# Patient Record
Sex: Male | Born: 1950 | Race: Black or African American | Hispanic: No | Marital: Married
Health system: Southern US, Community
[De-identification: ages and names within clinical notes are randomized; demographics above are authoritative.]

---

## 2005-09-01 ENCOUNTER — Ambulatory Visit: Payer: Self-pay | Admitting: Internal Medicine

## 2005-09-05 ENCOUNTER — Ambulatory Visit: Payer: Self-pay | Admitting: Internal Medicine

## 2005-09-12 ENCOUNTER — Ambulatory Visit: Payer: Self-pay | Admitting: Internal Medicine

## 2005-10-05 ENCOUNTER — Emergency Department: Payer: Self-pay | Admitting: Emergency Medicine

## 2005-10-05 ENCOUNTER — Other Ambulatory Visit: Payer: Self-pay

## 2006-08-13 ENCOUNTER — Ambulatory Visit: Payer: Self-pay | Admitting: Gastroenterology

## 2007-08-13 ENCOUNTER — Ambulatory Visit: Payer: Self-pay | Admitting: Cardiology

## 2011-07-10 ENCOUNTER — Ambulatory Visit: Payer: Self-pay | Admitting: Internal Medicine

## 2011-08-01 ENCOUNTER — Inpatient Hospital Stay: Payer: Self-pay | Admitting: Internal Medicine

## 2011-08-10 ENCOUNTER — Ambulatory Visit: Payer: Self-pay | Admitting: Internal Medicine

## 2011-08-31 ENCOUNTER — Ambulatory Visit: Payer: Self-pay | Admitting: Family Medicine

## 2012-01-09 ENCOUNTER — Inpatient Hospital Stay: Payer: Self-pay | Admitting: Internal Medicine

## 2012-01-09 LAB — COMPREHENSIVE METABOLIC PANEL
Albumin: 3.4 g/dL (ref 3.4–5.0)
Alkaline Phosphatase: 125 U/L (ref 50–136)
Anion Gap: 9 (ref 7–16)
Chloride: 108 mmol/L — ABNORMAL HIGH (ref 98–107)
Creatinine: 1.42 mg/dL — ABNORMAL HIGH (ref 0.60–1.30)
EGFR (Non-African Amer.): 54 — ABNORMAL LOW
Osmolality: 305 (ref 275–301)
Potassium: 4.2 mmol/L (ref 3.5–5.1)
SGPT (ALT): 18 U/L

## 2012-01-09 LAB — TSH: Thyroid Stimulating Horm: 2.92 u[IU]/mL

## 2012-01-09 LAB — URINALYSIS, COMPLETE
Bilirubin,UR: NEGATIVE
Nitrite: NEGATIVE
Ph: 6 (ref 4.5–8.0)
Protein: 75
RBC,UR: 28 /HPF (ref 0–5)
Transitional Epi: 1

## 2012-01-09 LAB — CBC
HCT: 41.4 % (ref 40.0–52.0)
HGB: 13.5 g/dL (ref 13.0–18.0)
MCHC: 32.5 g/dL (ref 32.0–36.0)
WBC: 26.2 10*3/uL — ABNORMAL HIGH (ref 3.8–10.6)

## 2012-01-09 LAB — BASIC METABOLIC PANEL
BUN: 38 mg/dL — ABNORMAL HIGH (ref 7–18)
Calcium, Total: 9.4 mg/dL (ref 8.5–10.1)
Co2: 28 mmol/L (ref 21–32)
Creatinine: 1.33 mg/dL — ABNORMAL HIGH (ref 0.60–1.30)
EGFR (African American): >60
Glucose: 112 mg/dL — ABNORMAL HIGH (ref 65–99)
Osmolality: 298 (ref 275–301)
Potassium: 4.8 mmol/L (ref 3.5–5.1)

## 2012-01-09 LAB — CK TOTAL AND CKMB (NOT AT ARMC)
CK, Total: 97 U/L (ref 35–232)
CK-MB: 0.8 ng/mL (ref 0.5–3.6)

## 2012-01-10 LAB — CBC WITH DIFFERENTIAL/PLATELET
Basophil #: 0 10*3/uL (ref 0.0–0.1)
Basophil %: 0.2 %
Eosinophil %: 1 %
HCT: 34.9 % — ABNORMAL LOW (ref 40.0–52.0)
HGB: 11.1 g/dL — ABNORMAL LOW (ref 13.0–18.0)
Lymphocyte #: 1.5 10*3/uL (ref 1.0–3.6)
Lymphocyte %: 8.2 %
MCH: 25.9 pg — ABNORMAL LOW (ref 26.0–34.0)
Monocyte %: 7.2 %
Neutrophil #: 14.8 10*3/uL — ABNORMAL HIGH (ref 1.4–6.5)
Neutrophil %: 83.4 %
Platelet: 289 10*3/uL (ref 150–440)
RBC: 4.29 10*6/uL — ABNORMAL LOW (ref 4.40–5.90)
RDW: 17.5 % — ABNORMAL HIGH (ref 11.5–14.5)
WBC: 17.8 10*3/uL — ABNORMAL HIGH (ref 3.8–10.6)

## 2012-01-10 LAB — BASIC METABOLIC PANEL
Anion Gap: 9 (ref 7–16)
BUN: 33 mg/dL — ABNORMAL HIGH (ref 7–18)
Calcium, Total: 9.2 mg/dL (ref 8.5–10.1)
Chloride: 112 mmol/L — ABNORMAL HIGH (ref 98–107)
Co2: 29 mmol/L (ref 21–32)
EGFR (African American): >60
Glucose: 125 mg/dL — ABNORMAL HIGH (ref 65–99)
Osmolality: 307 (ref 275–301)
Potassium: 3.9 mmol/L (ref 3.5–5.1)
Sodium: 150 mmol/L — ABNORMAL HIGH (ref 136–145)

## 2012-01-11 LAB — BASIC METABOLIC PANEL
BUN: 22 mg/dL — ABNORMAL HIGH (ref 7–18)
Calcium, Total: 8.5 mg/dL (ref 8.5–10.1)
Chloride: 105 mmol/L (ref 98–107)
Co2: 27 mmol/L (ref 21–32)
Creatinine: 1.06 mg/dL (ref 0.60–1.30)
EGFR (African American): >60
EGFR (Non-African Amer.): >60
Osmolality: 285 (ref 275–301)
Potassium: 3.7 mmol/L (ref 3.5–5.1)
Sodium: 141 mmol/L (ref 136–145)

## 2012-01-11 LAB — CBC WITH DIFFERENTIAL/PLATELET
Basophil #: 0 10*3/uL (ref 0.0–0.1)
Basophil %: 0.1 %
HCT: 31.1 % — ABNORMAL LOW (ref 40.0–52.0)
Lymphocyte #: 1.5 10*3/uL (ref 1.0–3.6)
MCHC: 32.2 g/dL (ref 32.0–36.0)
MCV: 81 fL (ref 80–100)
Monocyte #: 1 10*3/uL — ABNORMAL HIGH (ref 0.0–0.7)
Monocyte %: 7.4 %
Neutrophil %: 79.4 %
RDW: 17.5 % — ABNORMAL HIGH (ref 11.5–14.5)
WBC: 13.3 10*3/uL — ABNORMAL HIGH (ref 3.8–10.6)

## 2012-01-11 LAB — URINE CULTURE

## 2012-01-12 LAB — BASIC METABOLIC PANEL
BUN: 12 mg/dL (ref 7–18)
Calcium, Total: 8.6 mg/dL (ref 8.5–10.1)
Chloride: 105 mmol/L (ref 98–107)
Co2: 25 mmol/L (ref 21–32)
Creatinine: 0.93 mg/dL (ref 0.60–1.30)
EGFR (African American): >60
Osmolality: 282 (ref 275–301)
Potassium: 3.5 mmol/L (ref 3.5–5.1)
Sodium: 141 mmol/L (ref 136–145)

## 2012-01-12 LAB — CBC WITH DIFFERENTIAL/PLATELET
Basophil #: 0 10*3/uL (ref 0.0–0.1)
Eosinophil %: 1.5 %
HCT: 31.2 % — ABNORMAL LOW (ref 40.0–52.0)
HGB: 9.9 g/dL — ABNORMAL LOW (ref 13.0–18.0)
Lymphocyte #: 1.5 10*3/uL (ref 1.0–3.6)
Lymphocyte %: 13.1 %
MCV: 82 fL (ref 80–100)
Neutrophil %: 73.5 %
Platelet: 246 10*3/uL (ref 150–440)
RDW: 16.4 % — ABNORMAL HIGH (ref 11.5–14.5)
WBC: 11.2 10*3/uL — ABNORMAL HIGH (ref 3.8–10.6)

## 2012-01-14 LAB — CBC WITH DIFFERENTIAL/PLATELET
Basophil #: 0 10*3/uL (ref 0.0–0.1)
Basophil %: 0.2 %
Eosinophil #: 0.2 10*3/uL (ref 0.0–0.7)
Eosinophil %: 1.4 %
HGB: 10.1 g/dL — ABNORMAL LOW (ref 13.0–18.0)
Lymphocyte #: 1.5 10*3/uL (ref 1.0–3.6)
MCV: 81 fL (ref 80–100)
Monocyte #: 1.1 10*3/uL — ABNORMAL HIGH (ref 0.0–0.7)
Monocyte %: 9.9 %
Neutrophil #: 8.5 10*3/uL — ABNORMAL HIGH (ref 1.4–6.5)
Neutrophil %: 75.2 %
RBC: 3.88 10*6/uL — ABNORMAL LOW (ref 4.40–5.90)
RDW: 17.3 % — ABNORMAL HIGH (ref 11.5–14.5)
WBC: 11.3 10*3/uL — ABNORMAL HIGH (ref 3.8–10.6)

## 2012-01-14 LAB — BASIC METABOLIC PANEL
BUN: 14 mg/dL (ref 7–18)
Calcium, Total: 8.8 mg/dL (ref 8.5–10.1)
Creatinine: 0.81 mg/dL (ref 0.60–1.30)
Potassium: 4 mmol/L (ref 3.5–5.1)

## 2012-02-06 ENCOUNTER — Encounter: Payer: Self-pay | Admitting: Family Medicine

## 2012-02-07 ENCOUNTER — Encounter: Payer: Self-pay | Admitting: Family Medicine

## 2012-03-06 ENCOUNTER — Emergency Department: Payer: Self-pay

## 2012-03-06 LAB — URINALYSIS, COMPLETE
Bilirubin,UR: NEGATIVE
Glucose,UR: NEGATIVE mg/dL (ref 0–75)
Ketone: NEGATIVE
Ph: 9 (ref 4.5–8.0)
Protein: NEGATIVE
RBC,UR: 193 /HPF (ref 0–5)
WBC UR: 185 /HPF (ref 0–5)

## 2012-03-06 LAB — BASIC METABOLIC PANEL
BUN: 24 mg/dL — ABNORMAL HIGH (ref 7–18)
Calcium, Total: 8.6 mg/dL (ref 8.5–10.1)
Chloride: 104 mmol/L (ref 98–107)
Creatinine: 0.84 mg/dL (ref 0.60–1.30)
EGFR (African American): >60
EGFR (Non-African Amer.): >60
Osmolality: 283 (ref 275–301)
Sodium: 140 mmol/L (ref 136–145)

## 2012-03-06 LAB — CBC
MCH: 26.4 pg (ref 26.0–34.0)
MCV: 82 fL (ref 80–100)
Platelet: 305 10*3/uL (ref 150–440)
RBC: 3.97 10*6/uL — ABNORMAL LOW (ref 4.40–5.90)
RDW: 18.2 % — ABNORMAL HIGH (ref 11.5–14.5)

## 2012-03-08 LAB — URINE CULTURE

## 2012-05-11 ENCOUNTER — Emergency Department: Payer: Self-pay | Admitting: *Deleted

## 2012-05-11 LAB — URINALYSIS, COMPLETE
Blood: NEGATIVE
Glucose,UR: NEGATIVE mg/dL (ref 0–75)
Ketone: NEGATIVE
Protein: 30
Specific Gravity: 1.006 (ref 1.003–1.030)
WBC UR: 45 /HPF (ref 0–5)

## 2012-05-11 LAB — COMPREHENSIVE METABOLIC PANEL
Albumin: 2.7 g/dL — ABNORMAL LOW (ref 3.4–5.0)
Anion Gap: 8 (ref 7–16)
Calcium, Total: 9.1 mg/dL (ref 8.5–10.1)
Chloride: 103 mmol/L (ref 98–107)
EGFR (African American): >60
EGFR (Non-African Amer.): >60
Glucose: 113 mg/dL — ABNORMAL HIGH (ref 65–99)
Osmolality: 280 (ref 275–301)
Potassium: 4.5 mmol/L (ref 3.5–5.1)
SGOT(AST): 29 U/L (ref 15–37)
Sodium: 139 mmol/L (ref 136–145)
Total Protein: 7.7 g/dL (ref 6.4–8.2)

## 2012-05-11 LAB — CBC WITH DIFFERENTIAL/PLATELET
Basophil %: 1.8 %
Eosinophil %: 1.8 %
MCH: 26.8 pg (ref 26.0–34.0)
MCHC: 32.8 g/dL (ref 32.0–36.0)
MCV: 82 fL (ref 80–100)
Monocyte #: 1.4 x10 3/mm — ABNORMAL HIGH (ref 0.2–1.0)
Neutrophil #: 11.4 10*3/uL — ABNORMAL HIGH (ref 1.4–6.5)
Neutrophil %: 76.2 %
RBC: 4.46 10*6/uL (ref 4.40–5.90)
WBC: 14.9 10*3/uL — ABNORMAL HIGH (ref 3.8–10.6)

## 2012-09-07 ENCOUNTER — Emergency Department: Payer: Self-pay | Admitting: Emergency Medicine

## 2012-09-18 ENCOUNTER — Inpatient Hospital Stay: Payer: Self-pay | Admitting: Internal Medicine

## 2012-09-18 LAB — URINALYSIS, COMPLETE
Bacteria: NEGATIVE
Glucose,UR: NEGATIVE mg/dL (ref 0–75)
Ketone: NEGATIVE
Nitrite: NEGATIVE
Ph: 6 (ref 4.5–8.0)
Protein: 30

## 2012-09-18 LAB — CBC WITH DIFFERENTIAL/PLATELET
Basophil #: 0.1 10*3/uL (ref 0.0–0.1)
Basophil %: 0.3 %
Eosinophil #: 0 10*3/uL (ref 0.0–0.7)
Eosinophil %: 0.1 %
HGB: 10.6 g/dL — ABNORMAL LOW (ref 13.0–18.0)
Lymphocyte %: 3.7 %
MCHC: 31.4 g/dL — ABNORMAL LOW (ref 32.0–36.0)
MCV: 81 fL (ref 80–100)
Monocyte #: 1.6 x10 3/mm — ABNORMAL HIGH (ref 0.2–1.0)
Neutrophil %: 90.1 %
Platelet: 356 10*3/uL (ref 150–440)
RBC: 4.19 10*6/uL — ABNORMAL LOW (ref 4.40–5.90)

## 2012-09-18 LAB — COMPREHENSIVE METABOLIC PANEL
BUN: 23 mg/dL — ABNORMAL HIGH (ref 7–18)
Bilirubin,Total: 0.2 mg/dL (ref 0.2–1.0)
Chloride: 104 mmol/L (ref 98–107)
Co2: 27 mmol/L (ref 21–32)
Creatinine: 0.99 mg/dL (ref 0.60–1.30)
EGFR (African American): >60
EGFR (Non-African Amer.): >60
Osmolality: 283 (ref 275–301)
Potassium: 4.7 mmol/L (ref 3.5–5.1)
SGOT(AST): 20 U/L (ref 15–37)
SGPT (ALT): 17 U/L (ref 12–78)
Sodium: 139 mmol/L (ref 136–145)
Total Protein: 7.8 g/dL (ref 6.4–8.2)

## 2012-09-18 LAB — RAPID INFLUENZA A&B ANTIGENS

## 2012-09-19 LAB — CBC WITH DIFFERENTIAL/PLATELET
Basophil #: 0.1 10*3/uL (ref 0.0–0.1)
Basophil %: 0.4 %
Eosinophil #: 0.3 10*3/uL (ref 0.0–0.7)
HGB: 9.6 g/dL — ABNORMAL LOW (ref 13.0–18.0)
Lymphocyte #: 0.8 10*3/uL — ABNORMAL LOW (ref 1.0–3.6)
Lymphocyte %: 4.7 %
MCHC: 31.5 g/dL — ABNORMAL LOW (ref 32.0–36.0)
MCV: 81 fL (ref 80–100)
Monocyte %: 7.5 %
Neutrophil %: 85.5 %

## 2012-09-19 LAB — BASIC METABOLIC PANEL
BUN: 20 mg/dL — ABNORMAL HIGH (ref 7–18)
Calcium, Total: 8.6 mg/dL (ref 8.5–10.1)
Co2: 26 mmol/L (ref 21–32)
EGFR (African American): >60
Glucose: 140 mg/dL — ABNORMAL HIGH (ref 65–99)
Osmolality: 286 (ref 275–301)
Potassium: 4 mmol/L (ref 3.5–5.1)
Sodium: 141 mmol/L (ref 136–145)

## 2012-09-20 LAB — VANCOMYCIN, TROUGH: Vancomycin, Trough: 16 ug/mL (ref 10–20)

## 2012-09-20 LAB — URINE CULTURE

## 2012-09-21 LAB — CBC WITH DIFFERENTIAL/PLATELET
Basophil #: 0.1 10*3/uL (ref 0.0–0.1)
Lymphocyte #: 1.1 10*3/uL (ref 1.0–3.6)
MCH: 26.8 pg (ref 26.0–34.0)
MCHC: 32.9 g/dL (ref 32.0–36.0)
MCV: 81 fL (ref 80–100)
Monocyte #: 1.6 x10 3/mm — ABNORMAL HIGH (ref 0.2–1.0)
Monocyte %: 8.5 %
Neutrophil %: 83.4 %
Platelet: 339 10*3/uL (ref 150–440)
RBC: 3.74 10*6/uL — ABNORMAL LOW (ref 4.40–5.90)
RDW: 15.7 % — ABNORMAL HIGH (ref 11.5–14.5)
WBC: 19.2 10*3/uL — ABNORMAL HIGH (ref 3.8–10.6)

## 2012-09-22 LAB — CREATININE, SERUM
Creatinine: 0.94 mg/dL
EGFR (African American): >60
EGFR (Non-African Amer.): >60

## 2012-09-22 LAB — CBC WITH DIFFERENTIAL/PLATELET
Basophil #: 0 10*3/uL
Basophil %: 0.3 %
Eosinophil #: 0.3 10*3/uL
Eosinophil %: 1.8 %
HCT: 28.5 % — ABNORMAL LOW
HGB: 9.5 g/dL — ABNORMAL LOW
Lymphocyte %: 10.2 %
Lymphs Abs: 1.6 10*3/uL
MCH: 26.9 pg
MCHC: 33.2 g/dL
MCV: 81 fL
Monocyte #: 1.6 10*3/uL — ABNORMAL HIGH
Monocyte %: 9.9 %
Neutrophil #: 12.6 10*3/uL — ABNORMAL HIGH
Neutrophil %: 77.8 %
Platelet: 318 10*3/uL
RBC: 3.51 x10 6/mm 3 — ABNORMAL LOW
RDW: 16.2 % — ABNORMAL HIGH
WBC: 16.2 10*3/uL — ABNORMAL HIGH

## 2012-09-23 LAB — CBC WITH DIFFERENTIAL/PLATELET
Basophil %: 0.5 %
Eosinophil %: 1.9 %
HCT: 29.1 % — ABNORMAL LOW (ref 40.0–52.0)
HGB: 9.2 g/dL — ABNORMAL LOW (ref 13.0–18.0)
MCH: 25.6 pg — ABNORMAL LOW (ref 26.0–34.0)
MCV: 81 fL (ref 80–100)
Monocyte #: 1.7 x10 3/mm — ABNORMAL HIGH (ref 0.2–1.0)
Monocyte %: 11.4 %
Neutrophil #: 11.3 10*3/uL — ABNORMAL HIGH (ref 1.4–6.5)
Neutrophil %: 75.9 %
Platelet: 312 10*3/uL (ref 150–440)
RBC: 3.6 10*6/uL — ABNORMAL LOW (ref 4.40–5.90)
WBC: 14.9 10*3/uL — ABNORMAL HIGH (ref 3.8–10.6)

## 2012-09-24 LAB — COMPREHENSIVE METABOLIC PANEL
Anion Gap: 7 (ref 7–16)
BUN: 11 mg/dL (ref 7–18)
Bilirubin,Total: 0.1 mg/dL — ABNORMAL LOW (ref 0.2–1.0)
Chloride: 107 mmol/L (ref 98–107)
Co2: 25 mmol/L (ref 21–32)
Creatinine: 0.92 mg/dL (ref 0.60–1.30)
EGFR (African American): >60
EGFR (Non-African Amer.): >60
Potassium: 3.8 mmol/L (ref 3.5–5.1)
SGPT (ALT): 13 U/L (ref 12–78)
Sodium: 139 mmol/L (ref 136–145)
Total Protein: 6.8 g/dL (ref 6.4–8.2)

## 2012-09-24 LAB — CBC WITH DIFFERENTIAL/PLATELET
Basophil #: 0.1 10*3/uL (ref 0.0–0.1)
Eosinophil #: 0.3 10*3/uL (ref 0.0–0.7)
Eosinophil %: 2.4 %
Lymphocyte #: 1.6 10*3/uL (ref 1.0–3.6)
MCH: 26.3 pg (ref 26.0–34.0)
MCHC: 32.7 g/dL (ref 32.0–36.0)
Monocyte #: 1.5 x10 3/mm — ABNORMAL HIGH (ref 0.2–1.0)
Platelet: 346 10*3/uL (ref 150–440)
RDW: 15.5 % — ABNORMAL HIGH (ref 11.5–14.5)
WBC: 14.1 10*3/uL — ABNORMAL HIGH (ref 3.8–10.6)

## 2012-09-25 LAB — CBC WITH DIFFERENTIAL/PLATELET
Basophil #: 0.1 10*3/uL (ref 0.0–0.1)
Eosinophil #: 0.3 10*3/uL (ref 0.0–0.7)
HCT: 29.1 % — ABNORMAL LOW (ref 40.0–52.0)
HGB: 9.4 g/dL — ABNORMAL LOW (ref 13.0–18.0)
Lymphocyte %: 12.5 %
MCH: 25.8 pg — ABNORMAL LOW (ref 26.0–34.0)
MCHC: 32.2 g/dL (ref 32.0–36.0)
Neutrophil #: 11 10*3/uL — ABNORMAL HIGH (ref 1.4–6.5)
Neutrophil %: 75.7 %
RBC: 3.64 10*6/uL — ABNORMAL LOW (ref 4.40–5.90)
RDW: 15.7 % — ABNORMAL HIGH (ref 11.5–14.5)
WBC: 14.5 10*3/uL — ABNORMAL HIGH (ref 3.8–10.6)

## 2012-09-25 LAB — CULTURE, BLOOD (SINGLE)

## 2012-09-30 ENCOUNTER — Ambulatory Visit: Payer: Self-pay | Admitting: Internal Medicine

## 2012-10-09 ENCOUNTER — Ambulatory Visit: Payer: Self-pay | Admitting: Internal Medicine

## 2012-10-17 LAB — CEA: CEA: 8.3 ng/mL — ABNORMAL HIGH (ref 0.0–4.7)

## 2012-11-09 ENCOUNTER — Ambulatory Visit: Payer: Self-pay | Admitting: Internal Medicine

## 2012-11-13 LAB — COMPREHENSIVE METABOLIC PANEL
Albumin: 2.8 g/dL — ABNORMAL LOW (ref 3.4–5.0)
Alkaline Phosphatase: 182 U/L — ABNORMAL HIGH (ref 50–136)
Anion Gap: 11 (ref 7–16)
Bilirubin,Total: 0.1 mg/dL — ABNORMAL LOW (ref 0.2–1.0)
Chloride: 99 mmol/L (ref 98–107)
Co2: 29 mmol/L (ref 21–32)
Creatinine: 0.91 mg/dL (ref 0.60–1.30)
EGFR (Non-African Amer.): >60
Potassium: 4.7 mmol/L (ref 3.5–5.1)
SGOT(AST): 21 U/L (ref 15–37)
SGPT (ALT): 16 U/L (ref 12–78)

## 2012-11-13 LAB — CBC CANCER CENTER
Basophil %: 0.8 %
Eosinophil #: 0.4 x10 3/mm (ref 0.0–0.7)
HCT: 33.9 % — ABNORMAL LOW (ref 40.0–52.0)
HGB: 10.7 g/dL — ABNORMAL LOW (ref 13.0–18.0)
MCHC: 31.6 g/dL — ABNORMAL LOW (ref 32.0–36.0)
Monocyte #: 1.6 x10 3/mm — ABNORMAL HIGH (ref 0.2–1.0)
Monocyte %: 8.6 %
Neutrophil #: 13.8 x10 3/mm — ABNORMAL HIGH (ref 1.4–6.5)
Platelet: 278 x10 3/mm (ref 150–440)
RBC: 4.36 10*6/uL — ABNORMAL LOW (ref 4.40–5.90)
RDW: 17.6 % — ABNORMAL HIGH (ref 11.5–14.5)

## 2012-12-07 ENCOUNTER — Ambulatory Visit: Payer: Self-pay | Admitting: Internal Medicine

## 2012-12-11 LAB — CANCER CENTER HEMOGLOBIN: HGB: 10.5 g/dL — ABNORMAL LOW (ref 13.0–18.0)

## 2012-12-12 LAB — CEA: CEA: 11 ng/mL — ABNORMAL HIGH (ref 0.0–4.7)

## 2012-12-13 ENCOUNTER — Emergency Department: Payer: Self-pay | Admitting: Emergency Medicine

## 2013-01-07 ENCOUNTER — Ambulatory Visit: Payer: Self-pay | Admitting: Internal Medicine

## 2013-01-08 LAB — CBC CANCER CENTER
Basophil #: 0.2 x10 3/mm — ABNORMAL HIGH (ref 0.0–0.1)
Eosinophil %: 1.8 %
HGB: 9.5 g/dL — ABNORMAL LOW (ref 13.0–18.0)
Lymphocyte #: 2.1 x10 3/mm (ref 1.0–3.6)
MCH: 21.6 pg — ABNORMAL LOW (ref 26.0–34.0)
MCV: 71 fL — ABNORMAL LOW (ref 80–100)
Monocyte #: 1.5 x10 3/mm — ABNORMAL HIGH (ref 0.2–1.0)
Neutrophil #: 15.3 x10 3/mm — ABNORMAL HIGH (ref 1.4–6.5)
Neutrophil %: 78.8 %
RDW: 17.6 % — ABNORMAL HIGH (ref 11.5–14.5)
WBC: 19.4 x10 3/mm — ABNORMAL HIGH (ref 3.8–10.6)

## 2013-01-09 ENCOUNTER — Emergency Department: Payer: Self-pay | Admitting: Emergency Medicine

## 2013-01-09 LAB — CEA: CEA: 13.5 ng/mL — ABNORMAL HIGH (ref 0.0–4.7)

## 2013-01-27 LAB — CBC CANCER CENTER
Basophil #: 0.1 x10 3/mm (ref 0.0–0.1)
Eosinophil #: 0.2 x10 3/mm (ref 0.0–0.7)
HCT: 33 % — ABNORMAL LOW (ref 40.0–52.0)
Lymphocyte #: 2 x10 3/mm (ref 1.0–3.6)
Lymphocyte %: 11.4 %
MCH: 20.8 pg — ABNORMAL LOW (ref 26.0–34.0)
MCV: 72 fL — ABNORMAL LOW (ref 80–100)
Monocyte #: 1.1 x10 3/mm — ABNORMAL HIGH (ref 0.2–1.0)
Neutrophil #: 14 x10 3/mm — ABNORMAL HIGH (ref 1.4–6.5)
Neutrophil %: 80.6 %
RBC: 4.6 10*6/uL (ref 4.40–5.90)
RDW: 19 % — ABNORMAL HIGH (ref 11.5–14.5)
WBC: 17.3 x10 3/mm — ABNORMAL HIGH (ref 3.8–10.6)

## 2013-01-27 LAB — IRON AND TIBC
Iron Bind.Cap.(Total): 464 ug/dL — ABNORMAL HIGH (ref 250–450)
Iron Saturation: 8 %
Iron: 35 ug/dL — ABNORMAL LOW (ref 65–175)

## 2013-01-27 LAB — FERRITIN: Ferritin (ARMC): 25 ng/mL (ref 8–388)

## 2013-01-28 LAB — CEA: CEA: 21.2 ng/mL — ABNORMAL HIGH (ref 0.0–4.7)

## 2013-02-03 LAB — CREATININE, SERUM
Creatinine: 1.17 mg/dL (ref 0.60–1.30)
EGFR (African American): >60

## 2013-02-06 ENCOUNTER — Ambulatory Visit: Payer: Self-pay | Admitting: Internal Medicine

## 2013-02-26 LAB — CBC CANCER CENTER
HCT: 30.1 % — ABNORMAL LOW (ref 40.0–52.0)
HGB: 9 g/dL — ABNORMAL LOW (ref 13.0–18.0)
Monocytes: 7 %
RBC: 4.41 10*6/uL (ref 4.40–5.90)
Segmented Neutrophils: 83 %

## 2013-02-26 LAB — HEPATIC FUNCTION PANEL A (ARMC)
Alkaline Phosphatase: 167 U/L — ABNORMAL HIGH (ref 50–136)
Bilirubin, Direct: <0.05 mg/dL (ref 0.00–0.20)
Bilirubin,Total: 0.2 mg/dL (ref 0.2–1.0)
SGOT(AST): 22 U/L (ref 15–37)
SGPT (ALT): 22 U/L (ref 12–78)
Total Protein: 8.6 g/dL — ABNORMAL HIGH (ref 6.4–8.2)

## 2013-02-26 LAB — CREATININE, SERUM
Creatinine: 1 mg/dL (ref 0.60–1.30)
EGFR (African American): >60

## 2013-02-27 LAB — CEA: CEA: 17.2 ng/mL — ABNORMAL HIGH (ref 0.0–4.7)

## 2013-03-09 ENCOUNTER — Ambulatory Visit: Payer: Self-pay | Admitting: Internal Medicine

## 2013-03-12 LAB — PROTIME-INR
INR: 1
Prothrombin Time: 13.5 secs (ref 11.5–14.7)

## 2013-03-12 LAB — CBC WITH DIFFERENTIAL/PLATELET
Basophil %: 0.5 %
Eosinophil #: 0.1 10*3/uL (ref 0.0–0.7)
Eosinophil %: 0.4 %
HCT: 27.7 % — ABNORMAL LOW (ref 40.0–52.0)
HGB: 8.2 g/dL — ABNORMAL LOW (ref 13.0–18.0)
Lymphocyte %: 3.6 %
MCH: 20 pg — ABNORMAL LOW (ref 26.0–34.0)
MCHC: 29.7 g/dL — ABNORMAL LOW (ref 32.0–36.0)
Monocyte #: 1.4 x10 3/mm — ABNORMAL HIGH (ref 0.2–1.0)
Monocyte %: 6.1 %
Neutrophil #: 20.6 10*3/uL — ABNORMAL HIGH (ref 1.4–6.5)
Neutrophil %: 89.4 %
RDW: 18.9 % — ABNORMAL HIGH (ref 11.5–14.5)

## 2013-03-12 LAB — MAGNESIUM: Magnesium: 2.3 mg/dL

## 2013-03-12 LAB — COMPREHENSIVE METABOLIC PANEL
Albumin: 2.3 g/dL — ABNORMAL LOW (ref 3.4–5.0)
Calcium, Total: 8.6 mg/dL (ref 8.5–10.1)
Creatinine: 1.01 mg/dL (ref 0.60–1.30)
EGFR (Non-African Amer.): >60
SGOT(AST): 31 U/L (ref 15–37)
SGPT (ALT): 20 U/L (ref 12–78)
Sodium: 140 mmol/L (ref 136–145)

## 2013-03-12 LAB — APTT: Activated PTT: 31.1 secs (ref 23.6–35.9)

## 2013-03-12 LAB — CK TOTAL AND CKMB (NOT AT ARMC): CK-MB: <0.5 ng/mL — ABNORMAL LOW (ref 0.5–3.6)

## 2013-03-13 ENCOUNTER — Inpatient Hospital Stay: Payer: Self-pay | Admitting: Specialist

## 2013-03-13 LAB — URINALYSIS, COMPLETE
Glucose,UR: NEGATIVE mg/dL (ref 0–75)
Ketone: NEGATIVE
Nitrite: NEGATIVE
Ph: 5 (ref 4.5–8.0)
Protein: 30
RBC,UR: 33 /HPF (ref 0–5)
Specific Gravity: 1.014 (ref 1.003–1.030)
Squamous Epithelial: 26
WBC UR: 198 /HPF (ref 0–5)

## 2013-03-14 LAB — BASIC METABOLIC PANEL
Creatinine: 0.94 mg/dL (ref 0.60–1.30)
EGFR (African American): >60
EGFR (Non-African Amer.): >60
Glucose: 116 mg/dL — ABNORMAL HIGH (ref 65–99)
Osmolality: 291 (ref 275–301)
Sodium: 142 mmol/L (ref 136–145)

## 2013-03-14 LAB — CBC WITH DIFFERENTIAL/PLATELET
Basophil #: 0 10*3/uL (ref 0.0–0.1)
Basophil %: 0.1 %
Eosinophil #: 0.3 10*3/uL (ref 0.0–0.7)
HGB: 7.5 g/dL — ABNORMAL LOW (ref 13.0–18.0)
MCH: 20 pg — ABNORMAL LOW (ref 26.0–34.0)
MCHC: 30.1 g/dL — ABNORMAL LOW (ref 32.0–36.0)
MCV: 67 fL — ABNORMAL LOW (ref 80–100)
Monocyte #: 1.7 x10 3/mm — ABNORMAL HIGH (ref 0.2–1.0)
Neutrophil %: 88.3 %
Platelet: 392 10*3/uL (ref 150–440)
RBC: 3.76 10*6/uL — ABNORMAL LOW (ref 4.40–5.90)
RDW: 19.2 % — ABNORMAL HIGH (ref 11.5–14.5)
WBC: 23.4 10*3/uL — ABNORMAL HIGH (ref 3.8–10.6)

## 2013-03-14 LAB — URINE CULTURE

## 2013-03-15 LAB — CBC WITH DIFFERENTIAL/PLATELET
Basophil #: 0.1 10*3/uL
Basophil %: 0.2 %
Eosinophil #: 0.4 10*3/uL
Eosinophil %: 1.6 %
HCT: 24.5 % — ABNORMAL LOW
HGB: 7.3 g/dL — ABNORMAL LOW
Lymphocyte %: 5.6 %
Lymphs Abs: 1.2 10*3/uL
MCH: 20.1 pg — ABNORMAL LOW
MCHC: 29.7 g/dL — ABNORMAL LOW
MCV: 68 fL — ABNORMAL LOW
Monocyte #: 2.2 10*3/uL — ABNORMAL HIGH
Monocyte %: 10.3 %
Neutrophil #: 17.8 10*3/uL — ABNORMAL HIGH
Neutrophil %: 82.3 %
Platelet: 382 10*3/uL
RBC: 3.61 x10 6/mm 3 — ABNORMAL LOW
RDW: 19 % — ABNORMAL HIGH
WBC: 21.6 10*3/uL — ABNORMAL HIGH

## 2013-03-16 LAB — CBC WITH DIFFERENTIAL/PLATELET
Eosinophil %: 2.1 %
HGB: 6.9 g/dL — ABNORMAL LOW (ref 13.0–18.0)
Lymphocyte #: 1 10*3/uL (ref 1.0–3.6)
Lymphocyte %: 5.5 %
MCH: 20 pg — ABNORMAL LOW (ref 26.0–34.0)
MCHC: 30.2 g/dL — ABNORMAL LOW (ref 32.0–36.0)
MCV: 66 fL — ABNORMAL LOW (ref 80–100)
Monocyte #: 1.8 x10 3/mm — ABNORMAL HIGH (ref 0.2–1.0)
RBC: 3.47 10*6/uL — ABNORMAL LOW (ref 4.40–5.90)
RDW: 19.2 % — ABNORMAL HIGH (ref 11.5–14.5)
WBC: 19.1 10*3/uL — ABNORMAL HIGH (ref 3.8–10.6)

## 2013-03-17 LAB — CBC WITH DIFFERENTIAL/PLATELET
Basophil #: 0 10*3/uL (ref 0.0–0.1)
Eosinophil #: 0.5 10*3/uL (ref 0.0–0.7)
Eosinophil %: 2.3 %
Lymphocyte #: 1 10*3/uL (ref 1.0–3.6)
Lymphocyte %: 5 %
MCH: 21.2 pg — ABNORMAL LOW (ref 26.0–34.0)
MCHC: 30.9 g/dL — ABNORMAL LOW (ref 32.0–36.0)
MCV: 69 fL — ABNORMAL LOW (ref 80–100)
Monocyte #: 1.7 x10 3/mm — ABNORMAL HIGH (ref 0.2–1.0)
Neutrophil #: 16.6 10*3/uL — ABNORMAL HIGH (ref 1.4–6.5)
RBC: 3.7 10*6/uL — ABNORMAL LOW (ref 4.40–5.90)
RDW: 20.4 % — ABNORMAL HIGH (ref 11.5–14.5)

## 2013-03-18 LAB — CBC WITH DIFFERENTIAL/PLATELET
Basophil #: 0 10*3/uL (ref 0.0–0.1)
HCT: 28 % — ABNORMAL LOW (ref 40.0–52.0)
HGB: 8.5 g/dL — ABNORMAL LOW (ref 13.0–18.0)
Lymphocyte #: 1.3 10*3/uL (ref 1.0–3.6)
MCV: 70 fL — ABNORMAL LOW (ref 80–100)
Neutrophil %: 83 %
RDW: 21.4 % — ABNORMAL HIGH (ref 11.5–14.5)
WBC: 20.8 10*3/uL — ABNORMAL HIGH (ref 3.8–10.6)

## 2013-03-18 LAB — CULTURE, BLOOD (SINGLE)

## 2013-03-19 LAB — URINALYSIS, COMPLETE
Bilirubin,UR: NEGATIVE
Blood: NEGATIVE
Glucose,UR: NEGATIVE mg/dL (ref 0–75)
Hyaline Cast: 4
Ketone: NEGATIVE
Ph: 5 (ref 4.5–8.0)
Protein: 30
Specific Gravity: 1.014 (ref 1.003–1.030)
Squamous Epithelial: <1
WBC UR: 37 /HPF (ref 0–5)

## 2013-03-19 LAB — COMPREHENSIVE METABOLIC PANEL
Alkaline Phosphatase: 133 U/L (ref 50–136)
BUN: 29 mg/dL — ABNORMAL HIGH (ref 7–18)
Co2: 35 mmol/L — ABNORMAL HIGH (ref 21–32)
Creatinine: 0.95 mg/dL (ref 0.60–1.30)
EGFR (Non-African Amer.): >60
SGPT (ALT): 19 U/L (ref 12–78)
Sodium: 138 mmol/L (ref 136–145)

## 2013-03-19 LAB — CBC WITH DIFFERENTIAL/PLATELET
Basophil %: 0.2 %
Eosinophil #: 0 10*3/uL (ref 0.0–0.7)
HGB: 7.8 g/dL — ABNORMAL LOW (ref 13.0–18.0)
Lymphocyte %: 4.4 %
MCHC: 30.2 g/dL — ABNORMAL LOW (ref 32.0–36.0)
MCV: 71 fL — ABNORMAL LOW (ref 80–100)
Monocyte #: 1.4 x10 3/mm — ABNORMAL HIGH (ref 0.2–1.0)
Monocyte %: 8.4 %
Neutrophil #: 14.4 10*3/uL — ABNORMAL HIGH (ref 1.4–6.5)
RBC: 3.67 10*6/uL — ABNORMAL LOW (ref 4.40–5.90)
RDW: 22.4 % — ABNORMAL HIGH (ref 11.5–14.5)
WBC: 16.6 10*3/uL — ABNORMAL HIGH (ref 3.8–10.6)

## 2013-03-19 LAB — TROPONIN I: Troponin-I: 0.02 ng/mL

## 2013-03-19 LAB — CK TOTAL AND CKMB (NOT AT ARMC): CK, Total: 132 U/L (ref 35–232)

## 2013-03-20 ENCOUNTER — Inpatient Hospital Stay: Payer: Self-pay | Admitting: Internal Medicine

## 2013-03-20 LAB — PROTIME-INR
INR: 1.2
Prothrombin Time: 15.1 secs — ABNORMAL HIGH (ref 11.5–14.7)

## 2013-03-20 LAB — CBC WITH DIFFERENTIAL/PLATELET
Basophil #: 0.1 10*3/uL (ref 0.0–0.1)
Basophil %: 0.3 %
Eosinophil #: 0.2 10*3/uL (ref 0.0–0.7)
Eosinophil %: 1.1 %
HCT: 23.8 % — ABNORMAL LOW (ref 40.0–52.0)
Lymphocyte #: 1 10*3/uL (ref 1.0–3.6)
MCH: 20.9 pg — ABNORMAL LOW (ref 26.0–34.0)
MCHC: 30.1 g/dL — ABNORMAL LOW (ref 32.0–36.0)
Monocyte #: 1.7 x10 3/mm — ABNORMAL HIGH (ref 0.2–1.0)
Monocyte %: 9.9 %
Neutrophil #: 14 10*3/uL — ABNORMAL HIGH (ref 1.4–6.5)
Neutrophil %: 82.5 %
Platelet: 371 10*3/uL (ref 150–440)
RDW: 22.1 % — ABNORMAL HIGH (ref 11.5–14.5)
WBC: 16.9 10*3/uL — ABNORMAL HIGH (ref 3.8–10.6)

## 2013-03-20 LAB — BASIC METABOLIC PANEL
BUN: 30 mg/dL — ABNORMAL HIGH (ref 7–18)
Calcium, Total: 8 mg/dL — ABNORMAL LOW (ref 8.5–10.1)
Creatinine: 1.13 mg/dL (ref 0.60–1.30)
EGFR (African American): >60
EGFR (Non-African Amer.): >60
Glucose: 130 mg/dL — ABNORMAL HIGH (ref 65–99)
Osmolality: 285 (ref 275–301)
Potassium: 4.4 mmol/L (ref 3.5–5.1)
Sodium: 139 mmol/L (ref 136–145)

## 2013-03-20 LAB — POTASSIUM: Potassium: 4.7 mmol/L (ref 3.5–5.1)

## 2013-03-21 LAB — CBC WITH DIFFERENTIAL/PLATELET
Basophil #: 0.1 10*3/uL (ref 0.0–0.1)
Basophil %: 0.3 %
Eosinophil %: 2 %
HCT: 23.4 % — ABNORMAL LOW (ref 40.0–52.0)
HGB: 7.2 g/dL — ABNORMAL LOW (ref 13.0–18.0)
Monocyte %: 7.9 %
RBC: 3.41 10*6/uL — ABNORMAL LOW (ref 4.40–5.90)
WBC: 19 10*3/uL — ABNORMAL HIGH (ref 3.8–10.6)

## 2013-03-21 LAB — COMPREHENSIVE METABOLIC PANEL
Albumin: 1.6 g/dL — ABNORMAL LOW (ref 3.4–5.0)
Alkaline Phosphatase: 121 U/L (ref 50–136)
BUN: 17 mg/dL (ref 7–18)
Bilirubin,Total: 0.1 mg/dL — ABNORMAL LOW (ref 0.2–1.0)
Calcium, Total: 7.8 mg/dL — ABNORMAL LOW (ref 8.5–10.1)
Chloride: 105 mmol/L (ref 98–107)
Co2: 28 mmol/L (ref 21–32)
EGFR (Non-African Amer.): >60
Osmolality: 282 (ref 275–301)
Potassium: 3.5 mmol/L (ref 3.5–5.1)
SGPT (ALT): 13 U/L (ref 12–78)
Sodium: 139 mmol/L (ref 136–145)
Total Protein: 5.8 g/dL — ABNORMAL LOW (ref 6.4–8.2)

## 2013-03-21 LAB — VANCOMYCIN, TROUGH: Vancomycin, Trough: 16 ug/mL (ref 10–20)

## 2013-03-21 LAB — PHOSPHORUS
Phosphorus: 2 mg/dL — ABNORMAL LOW (ref 2.5–4.9)
Phosphorus: 3.3 mg/dL (ref 2.5–4.9)

## 2013-03-21 LAB — MAGNESIUM
Magnesium: 1.4 mg/dL — ABNORMAL LOW
Magnesium: 2.2 mg/dL

## 2013-03-21 LAB — POTASSIUM: Potassium: 3.6 mmol/L (ref 3.5–5.1)

## 2013-03-21 LAB — TRIGLYCERIDES: Triglycerides: 68 mg/dL (ref 0–200)

## 2013-03-21 LAB — URINE CULTURE

## 2013-03-22 LAB — BASIC METABOLIC PANEL
BUN: 18 mg/dL (ref 7–18)
Chloride: 107 mmol/L (ref 98–107)
Co2: 27 mmol/L (ref 21–32)
Creatinine: 0.73 mg/dL (ref 0.60–1.30)
EGFR (Non-African Amer.): >60
Glucose: 74 mg/dL (ref 65–99)
Sodium: 140 mmol/L (ref 136–145)

## 2013-03-22 LAB — MAGNESIUM: Magnesium: 2.1 mg/dL

## 2013-03-22 LAB — PHOSPHORUS: Phosphorus: 3.3 mg/dL

## 2013-03-23 LAB — PHOSPHORUS: Phosphorus: 3 mg/dL (ref 2.5–4.9)

## 2013-03-23 LAB — BASIC METABOLIC PANEL
Anion Gap: 6 — ABNORMAL LOW (ref 7–16)
Calcium, Total: 8.2 mg/dL — ABNORMAL LOW (ref 8.5–10.1)
Chloride: 109 mmol/L — ABNORMAL HIGH (ref 98–107)
Co2: 27 mmol/L (ref 21–32)
Creatinine: 0.85 mg/dL (ref 0.60–1.30)
EGFR (African American): >60
EGFR (Non-African Amer.): >60
Osmolality: 287 (ref 275–301)
Potassium: 3.5 mmol/L (ref 3.5–5.1)

## 2013-03-23 LAB — CBC WITH DIFFERENTIAL/PLATELET
Eosinophil %: 1.3 %
HGB: 7.5 g/dL — ABNORMAL LOW (ref 13.0–18.0)
Lymphocyte #: 1.1 10*3/uL (ref 1.0–3.6)
Lymphocyte %: 6.9 %
MCHC: 30.4 g/dL — ABNORMAL LOW (ref 32.0–36.0)
MCV: 69 fL — ABNORMAL LOW (ref 80–100)
Monocyte %: 6.6 %
Platelet: 406 10*3/uL (ref 150–440)
RBC: 3.58 10*6/uL — ABNORMAL LOW (ref 4.40–5.90)

## 2013-03-24 LAB — BASIC METABOLIC PANEL
Anion Gap: 5 — ABNORMAL LOW (ref 7–16)
BUN: 16 mg/dL (ref 7–18)
Calcium, Total: 7.8 mg/dL — ABNORMAL LOW (ref 8.5–10.1)
EGFR (Non-African Amer.): >60
Glucose: 110 mg/dL — ABNORMAL HIGH (ref 65–99)
Osmolality: 291 (ref 275–301)
Potassium: 3.6 mmol/L (ref 3.5–5.1)

## 2013-03-24 LAB — CBC WITH DIFFERENTIAL/PLATELET
Basophil #: 0.1 10*3/uL (ref 0.0–0.1)
Basophil %: 0.3 %
Eosinophil #: 0.3 10*3/uL (ref 0.0–0.7)
Eosinophil %: 2.3 %
HCT: 23.7 % — ABNORMAL LOW (ref 40.0–52.0)
Lymphocyte #: 1.2 10*3/uL (ref 1.0–3.6)
Lymphocyte %: 8.4 %
MCH: 21 pg — ABNORMAL LOW (ref 26.0–34.0)
MCV: 69 fL — ABNORMAL LOW (ref 80–100)
Monocyte #: 1.3 x10 3/mm — ABNORMAL HIGH (ref 0.2–1.0)
Monocyte %: 8.6 %
Neutrophil #: 11.9 10*3/uL — ABNORMAL HIGH (ref 1.4–6.5)
Neutrophil %: 80.4 %
Platelet: 432 10*3/uL (ref 150–440)
RDW: 22.3 % — ABNORMAL HIGH (ref 11.5–14.5)
WBC: 14.9 10*3/uL — ABNORMAL HIGH (ref 3.8–10.6)

## 2013-03-24 LAB — CREATININE, SERUM
EGFR (African American): >60
EGFR (Non-African Amer.): >60

## 2013-03-24 LAB — TRIGLYCERIDES: Triglycerides: 77 mg/dL (ref 0–200)

## 2013-03-24 LAB — GENTAMICIN LEVEL, TROUGH: Gentamicin, Trough: 5.4 ug/mL (ref 0.0–2.0)

## 2013-03-24 LAB — MAGNESIUM: Magnesium: 1.7 mg/dL — ABNORMAL LOW

## 2013-03-24 LAB — GENTAMICIN LEVEL, PEAK: Gentamicin, Peak: 11.3 ug/mL — ABNORMAL HIGH (ref 4.0–8.0)

## 2013-03-25 LAB — CBC WITH DIFFERENTIAL/PLATELET
Basophil #: 0 10*3/uL (ref 0.0–0.1)
Basophil %: 0.3 %
Eosinophil %: 2.1 %
HCT: 23.1 % — ABNORMAL LOW (ref 40.0–52.0)
MCH: 20.7 pg — ABNORMAL LOW (ref 26.0–34.0)
MCHC: 30.3 g/dL — ABNORMAL LOW (ref 32.0–36.0)
MCV: 69 fL — ABNORMAL LOW (ref 80–100)
Monocyte #: 1.5 x10 3/mm — ABNORMAL HIGH (ref 0.2–1.0)
Monocyte %: 9.4 %
Neutrophil #: 13 10*3/uL — ABNORMAL HIGH (ref 1.4–6.5)
Platelet: 438 10*3/uL (ref 150–440)
RDW: 22 % — ABNORMAL HIGH (ref 11.5–14.5)
WBC: 16.1 10*3/uL — ABNORMAL HIGH (ref 3.8–10.6)

## 2013-03-25 LAB — CULTURE, BLOOD (SINGLE)

## 2013-03-25 LAB — BASIC METABOLIC PANEL
BUN: 18 mg/dL (ref 7–18)
Chloride: 110 mmol/L — ABNORMAL HIGH (ref 98–107)
Co2: 28 mmol/L (ref 21–32)
EGFR (African American): >60
EGFR (Non-African Amer.): >60
Glucose: 87 mg/dL (ref 65–99)
Osmolality: 284 (ref 275–301)
Potassium: 3.6 mmol/L (ref 3.5–5.1)
Sodium: 142 mmol/L (ref 136–145)

## 2013-03-25 LAB — GENTAMICIN LEVEL, TROUGH: Gentamicin, Trough: 4.5 ug/mL (ref 0.0–2.0)

## 2013-03-25 LAB — CREATININE, SERUM: EGFR (Non-African Amer.): >60

## 2013-03-26 LAB — MAGNESIUM: Magnesium: 1.5 mg/dL — ABNORMAL LOW

## 2013-03-26 LAB — CBC WITH DIFFERENTIAL/PLATELET
Basophil %: 0.4 %
Eosinophil #: 0.4 10*3/uL (ref 0.0–0.7)
Eosinophil %: 3 %
HGB: 7 g/dL — ABNORMAL LOW (ref 13.0–18.0)
Lymphocyte #: 1.1 10*3/uL (ref 1.0–3.6)
Lymphocyte %: 8.4 %
MCH: 21.3 pg — ABNORMAL LOW (ref 26.0–34.0)
MCHC: 31.2 g/dL — ABNORMAL LOW (ref 32.0–36.0)
MCV: 68 fL — ABNORMAL LOW (ref 80–100)
Monocyte #: 1 x10 3/mm (ref 0.2–1.0)
Monocyte %: 7.5 %
Neutrophil %: 80.7 %
RBC: 3.3 10*6/uL — ABNORMAL LOW (ref 4.40–5.90)
RDW: 23 % — ABNORMAL HIGH (ref 11.5–14.5)

## 2013-03-26 LAB — BASIC METABOLIC PANEL
Anion Gap: 5 — ABNORMAL LOW (ref 7–16)
BUN: 19 mg/dL — ABNORMAL HIGH (ref 7–18)
Calcium, Total: 8.1 mg/dL — ABNORMAL LOW (ref 8.5–10.1)
Co2: 28 mmol/L (ref 21–32)
Creatinine: 0.78 mg/dL (ref 0.60–1.30)
EGFR (African American): >60
Osmolality: 288 (ref 275–301)
Potassium: 3.8 mmol/L (ref 3.5–5.1)
Sodium: 144 mmol/L (ref 136–145)

## 2013-03-26 LAB — PHOSPHORUS: Phosphorus: 3.5 mg/dL (ref 2.5–4.9)

## 2013-03-26 LAB — GENTAMICIN LEVEL, PEAK: Gentamicin, Peak: 7.5 ug/mL (ref 4.0–8.0)

## 2013-03-27 LAB — CBC WITH DIFFERENTIAL/PLATELET
Basophil #: 0.1 10*3/uL (ref 0.0–0.1)
Basophil %: 0.3 %
Eosinophil #: 0.3 10*3/uL (ref 0.0–0.7)
HGB: 7.4 g/dL — ABNORMAL LOW (ref 13.0–18.0)
Lymphocyte #: 0.8 10*3/uL — ABNORMAL LOW (ref 1.0–3.6)
MCH: 20.8 pg — ABNORMAL LOW (ref 26.0–34.0)
MCHC: 30.8 g/dL — ABNORMAL LOW (ref 32.0–36.0)
MCV: 68 fL — ABNORMAL LOW (ref 80–100)
Monocyte #: 1.6 x10 3/mm — ABNORMAL HIGH (ref 0.2–1.0)
Platelet: 488 10*3/uL — ABNORMAL HIGH (ref 150–440)
RBC: 3.56 10*6/uL — ABNORMAL LOW (ref 4.40–5.90)
RDW: 22.8 % — ABNORMAL HIGH (ref 11.5–14.5)
WBC: 20.9 10*3/uL — ABNORMAL HIGH (ref 3.8–10.6)

## 2013-03-27 LAB — BASIC METABOLIC PANEL
BUN: 20 mg/dL — ABNORMAL HIGH (ref 7–18)
Chloride: 107 mmol/L (ref 98–107)
Co2: 29 mmol/L (ref 21–32)
Creatinine: 1.17 mg/dL (ref 0.60–1.30)
EGFR (African American): >60
EGFR (Non-African Amer.): >60
Potassium: 4.2 mmol/L (ref 3.5–5.1)
Sodium: 140 mmol/L (ref 136–145)

## 2013-03-28 LAB — BASIC METABOLIC PANEL
Anion Gap: 9 (ref 7–16)
BUN: 35 mg/dL — ABNORMAL HIGH (ref 7–18)
Chloride: 101 mmol/L (ref 98–107)
EGFR (African American): 28 — ABNORMAL LOW
Glucose: 78 mg/dL (ref 65–99)
Osmolality: 275 (ref 275–301)
Potassium: 4.7 mmol/L (ref 3.5–5.1)
Sodium: 134 mmol/L — ABNORMAL LOW (ref 136–145)

## 2013-03-28 LAB — CBC WITH DIFFERENTIAL/PLATELET
Basophil #: 0.1 10*3/uL (ref 0.0–0.1)
Basophil %: 0.3 %
Eosinophil %: 3 %
MCH: 20.9 pg — ABNORMAL LOW (ref 26.0–34.0)
MCHC: 30.6 g/dL — ABNORMAL LOW (ref 32.0–36.0)
MCV: 68 fL — ABNORMAL LOW (ref 80–100)
Monocyte #: 1.7 x10 3/mm — ABNORMAL HIGH (ref 0.2–1.0)
Neutrophil #: 14.3 10*3/uL — ABNORMAL HIGH (ref 1.4–6.5)
Neutrophil %: 80.4 %
Platelet: 471 10*3/uL — ABNORMAL HIGH (ref 150–440)

## 2013-03-28 LAB — MAGNESIUM: Magnesium: 2.5 mg/dL — ABNORMAL HIGH

## 2013-03-28 LAB — PHOSPHORUS: Phosphorus: 5 mg/dL — ABNORMAL HIGH (ref 2.5–4.9)

## 2013-03-29 LAB — CBC WITH DIFFERENTIAL/PLATELET
Basophil #: 0.1 10*3/uL (ref 0.0–0.1)
Eosinophil %: 2.8 %
HCT: 23.2 % — ABNORMAL LOW (ref 40.0–52.0)
HGB: 7.6 g/dL — ABNORMAL LOW (ref 13.0–18.0)
Lymphocyte #: 0.8 10*3/uL — ABNORMAL LOW (ref 1.0–3.6)
Lymphocyte %: 5.2 %
MCH: 22.8 pg — ABNORMAL LOW (ref 26.0–34.0)
MCHC: 32.8 g/dL (ref 32.0–36.0)
MCV: 70 fL — ABNORMAL LOW (ref 80–100)
Monocyte #: 1.5 x10 3/mm — ABNORMAL HIGH (ref 0.2–1.0)
Monocyte %: 9.8 %
Neutrophil #: 12.7 10*3/uL — ABNORMAL HIGH (ref 1.4–6.5)
Platelet: 515 10*3/uL — ABNORMAL HIGH (ref 150–440)
RBC: 3.33 10*6/uL — ABNORMAL LOW (ref 4.40–5.90)

## 2013-03-29 LAB — CREATININE, SERUM: Creatinine: 4.2 mg/dL — ABNORMAL HIGH (ref 0.60–1.30)

## 2013-03-30 LAB — BASIC METABOLIC PANEL
Anion Gap: 13 (ref 7–16)
Anion Gap: 14 (ref 7–16)
Anion Gap: 10 (ref 7–16)
BUN: 70 mg/dL — ABNORMAL HIGH (ref 7–18)
Calcium, Total: 7.7 mg/dL — ABNORMAL LOW (ref 8.5–10.1)
Calcium, Total: 7.7 mg/dL — ABNORMAL LOW (ref 8.5–10.1)
Chloride: 94 mmol/L — ABNORMAL LOW (ref 98–107)
Chloride: 98 mmol/L (ref 98–107)
Co2: 19 mmol/L — ABNORMAL LOW (ref 21–32)
Co2: 23 mmol/L (ref 21–32)
Creatinine: 5.61 mg/dL — ABNORMAL HIGH (ref 0.60–1.30)
Creatinine: 4.03 mg/dL — ABNORMAL HIGH (ref 0.60–1.30)
EGFR (African American): 17 — ABNORMAL LOW
EGFR (Non-African Amer.): 15 — ABNORMAL LOW
Glucose: 99 mg/dL (ref 65–99)
Glucose: 93 mg/dL (ref 65–99)
Glucose: 98 mg/dL (ref 65–99)
Osmolality: 274 (ref 275–301)
Osmolality: 275 (ref 275–301)
Potassium: 3.7 mmol/L (ref 3.5–5.1)
Sodium: 126 mmol/L — ABNORMAL LOW (ref 136–145)
Sodium: 127 mmol/L — ABNORMAL LOW (ref 136–145)
Sodium: 131 mmol/L — ABNORMAL LOW (ref 136–145)

## 2013-03-30 LAB — CBC WITH DIFFERENTIAL/PLATELET
Basophil: 3 %
Comment - H1-Com5: NORMAL
Eosinophil: 4 %
HGB: 7.2 g/dL — ABNORMAL LOW (ref 13.0–18.0)
MCH: 22.7 pg — ABNORMAL LOW (ref 26.0–34.0)
MCHC: 32.7 g/dL (ref 32.0–36.0)
MCV: 69 fL — ABNORMAL LOW (ref 80–100)
Monocytes: 8 %
Platelet: 530 10*3/uL — ABNORMAL HIGH (ref 150–440)
RBC: 3.15 10*6/uL — ABNORMAL LOW (ref 4.40–5.90)
RDW: 25.1 % — ABNORMAL HIGH (ref 11.5–14.5)
Segmented Neutrophils: 80 %
WBC: 13.3 10*3/uL — ABNORMAL HIGH (ref 3.8–10.6)

## 2013-03-30 LAB — CREATININE, SERUM
EGFR (African American): 12 — ABNORMAL LOW
EGFR (Non-African Amer.): 10 — ABNORMAL LOW

## 2013-03-31 LAB — PHOSPHORUS: Phosphorus: 4 mg/dL (ref 2.5–4.9)

## 2013-03-31 LAB — BASIC METABOLIC PANEL
Anion Gap: 8 (ref 7–16)
Anion Gap: 7 (ref 7–16)
BUN: 39 mg/dL — ABNORMAL HIGH (ref 7–18)
BUN: 31 mg/dL — ABNORMAL HIGH (ref 7–18)
BUN: 24 mg/dL — ABNORMAL HIGH (ref 7–18)
Calcium, Total: 7.8 mg/dL — ABNORMAL LOW (ref 8.5–10.1)
Calcium, Total: 8.3 mg/dL — ABNORMAL LOW (ref 8.5–10.1)
Calcium, Total: 7.9 mg/dL — ABNORMAL LOW (ref 8.5–10.1)
Chloride: 101 mmol/L (ref 98–107)
Chloride: 101 mmol/L (ref 98–107)
Co2: 25 mmol/L (ref 21–32)
Creatinine: 3.34 mg/dL — ABNORMAL HIGH (ref 0.60–1.30)
Creatinine: 2.37 mg/dL — ABNORMAL HIGH (ref 0.60–1.30)
EGFR (African American): 22 — ABNORMAL LOW
EGFR (African American): 29 — ABNORMAL LOW
EGFR (Non-African Amer.): 19 — ABNORMAL LOW
Osmolality: 277 (ref 275–301)
Osmolality: 275 (ref 275–301)
Potassium: 3.6 mmol/L (ref 3.5–5.1)
Potassium: 3.4 mmol/L — ABNORMAL LOW (ref 3.5–5.1)
Sodium: 134 mmol/L — ABNORMAL LOW (ref 136–145)
Sodium: 134 mmol/L — ABNORMAL LOW (ref 136–145)
Sodium: 136 mmol/L (ref 136–145)

## 2013-03-31 LAB — CBC WITH DIFFERENTIAL/PLATELET
Basophil #: 0 10*3/uL (ref 0.0–0.1)
Basophil %: 0.2 %
Eosinophil #: 0.3 10*3/uL (ref 0.0–0.7)
Eosinophil %: 2.2 %
HGB: 7.3 g/dL — ABNORMAL LOW (ref 13.0–18.0)
Lymphocyte #: 0.4 10*3/uL — ABNORMAL LOW (ref 1.0–3.6)
Lymphocyte %: 2.9 %
MCHC: 31.8 g/dL — ABNORMAL LOW (ref 32.0–36.0)
Monocyte %: 9 %
Neutrophil #: 12.4 10*3/uL — ABNORMAL HIGH (ref 1.4–6.5)
Neutrophil %: 85.7 %
Platelet: 365 10*3/uL (ref 150–440)
WBC: 14.5 10*3/uL — ABNORMAL HIGH (ref 3.8–10.6)

## 2013-04-01 LAB — HEPATIC FUNCTION PANEL A (ARMC)
Alkaline Phosphatase: 27 U/L — ABNORMAL LOW (ref 50–136)
Bilirubin,Total: <0.1 mg/dL — ABNORMAL LOW (ref 0.2–1.0)
SGPT (ALT): 65 U/L (ref 12–78)
Total Protein: 5.8 g/dL — ABNORMAL LOW (ref 6.4–8.2)

## 2013-04-01 LAB — BASIC METABOLIC PANEL
Anion Gap: 4 — ABNORMAL LOW (ref 7–16)
BUN: 20 mg/dL — ABNORMAL HIGH (ref 7–18)
BUN: 20 mg/dL — ABNORMAL HIGH (ref 7–18)
Calcium, Total: 7.4 mg/dL — ABNORMAL LOW (ref 8.5–10.1)
Chloride: 102 mmol/L (ref 98–107)
Co2: 30 mmol/L (ref 21–32)
Creatinine: 1.29 mg/dL (ref 0.60–1.30)
EGFR (African American): >60
EGFR (Non-African Amer.): 59 — ABNORMAL LOW
Glucose: 58 mg/dL — ABNORMAL LOW (ref 65–99)
Osmolality: 270 (ref 275–301)
Osmolality: 276 (ref 275–301)
Potassium: 3.4 mmol/L — ABNORMAL LOW (ref 3.5–5.1)
Sodium: 135 mmol/L — ABNORMAL LOW (ref 136–145)
Sodium: 138 mmol/L (ref 136–145)

## 2013-04-01 LAB — CBC WITH DIFFERENTIAL/PLATELET
Basophil #: 0.1 10*3/uL (ref 0.0–0.1)
Basophil %: 0.6 %
Eosinophil: 2 %
HGB: 5.4 g/dL — ABNORMAL LOW (ref 13.0–18.0)
Lymphocyte #: 0.5 10*3/uL — ABNORMAL LOW (ref 1.0–3.6)
Lymphocyte #: 0.6 10*3/uL — ABNORMAL LOW (ref 1.0–3.6)
Lymphocyte %: 3 %
Lymphocyte %: 2.9 %
Lymphocytes: 9 %
Metamyelocyte: 1 %
Monocytes: 7 %
Neutrophil #: 14.1 10*3/uL — ABNORMAL HIGH (ref 1.4–6.5)
Neutrophil #: 16.2 10*3/uL — ABNORMAL HIGH (ref 1.4–6.5)
Neutrophil %: 83.4 %
Neutrophil %: 82.2 %
Platelet: 291 10*3/uL (ref 150–440)
Platelet: 304 10*3/uL (ref 150–440)
Platelet: 318 10*3/uL (ref 150–440)
Segmented Neutrophils: 77 %
WBC: 19.7 10*3/uL — ABNORMAL HIGH (ref 3.8–10.6)
WBC: 20.9 10*3/uL — ABNORMAL HIGH (ref 3.8–10.6)

## 2013-04-01 LAB — FIBRIN DEGRADATION PROD.(ARMC ONLY): Fibrin Degradation Prod.: >40 ug/ml (ref 2.1–7.7)

## 2013-04-01 LAB — PHOSPHORUS: Phosphorus: 2.9 mg/dL (ref 2.5–4.9)

## 2013-04-01 LAB — LACTATE DEHYDROGENASE: LDH: >4000 U/L — ABNORMAL HIGH (ref 85–241)

## 2013-04-02 LAB — HEPATIC FUNCTION PANEL A (ARMC)
Albumin: 1.5 g/dL — ABNORMAL LOW (ref 3.4–5.0)
Alkaline Phosphatase: 90 U/L (ref 50–136)
Bilirubin, Direct: <0.05 mg/dL (ref 0.00–0.20)
SGOT(AST): 711 U/L — ABNORMAL HIGH (ref 15–37)

## 2013-04-02 LAB — PHOSPHORUS
Phosphorus: 1.4 mg/dL — ABNORMAL LOW (ref 2.5–4.9)
Phosphorus: 1.7 mg/dL — ABNORMAL LOW (ref 2.5–4.9)

## 2013-04-02 LAB — BASIC METABOLIC PANEL
BUN: 15 mg/dL (ref 7–18)
Calcium, Total: 7.8 mg/dL — ABNORMAL LOW (ref 8.5–10.1)
Co2: 28 mmol/L (ref 21–32)
Creatinine: 1.26 mg/dL (ref 0.60–1.30)
Glucose: 71 mg/dL (ref 65–99)
Osmolality: 277 (ref 275–301)
Sodium: 139 mmol/L (ref 136–145)

## 2013-04-02 LAB — CBC WITH DIFFERENTIAL/PLATELET
Basophil #: 0.1 10*3/uL (ref 0.0–0.1)
Basophil %: 0.3 %
Basophil %: 0.3 %
Eosinophil #: 0.4 10*3/uL (ref 0.0–0.7)
Eosinophil #: 0.4 10*3/uL (ref 0.0–0.7)
Eosinophil %: 2.3 %
HGB: 6 g/dL — ABNORMAL LOW (ref 13.0–18.0)
Lymphocyte #: 0.5 10*3/uL — ABNORMAL LOW (ref 1.0–3.6)
Lymphocyte %: 2.3 %
Monocyte #: 2.2 x10 3/mm — ABNORMAL HIGH (ref 0.2–1.0)
Monocyte %: 10.9 %
Neutrophil #: 16.7 10*3/uL — ABNORMAL HIGH (ref 1.4–6.5)
Neutrophil %: 84.2 %
Neutrophil %: 84.1 %
Platelet: 341 10*3/uL (ref 150–440)
RDW: 24.4 % — ABNORMAL HIGH (ref 11.5–14.5)
WBC: 19.8 10*3/uL — ABNORMAL HIGH (ref 3.8–10.6)

## 2013-04-02 LAB — MAGNESIUM: Magnesium: 1.5 mg/dL — ABNORMAL LOW

## 2013-04-03 LAB — CBC WITH DIFFERENTIAL/PLATELET
Basophil %: 0.4 %
Eosinophil #: 0.2 10*3/uL (ref 0.0–0.7)
Lymphocyte #: 1.1 10*3/uL (ref 1.0–3.6)
Lymphocyte %: 4.7 %
Monocyte #: 3.1 x10 3/mm — ABNORMAL HIGH (ref 0.2–1.0)
Neutrophil #: 18.3 10*3/uL — ABNORMAL HIGH (ref 1.4–6.5)
Neutrophil %: 80.7 %
RDW: 24.9 % — ABNORMAL HIGH (ref 11.5–14.5)

## 2013-04-03 LAB — BASIC METABOLIC PANEL
BUN: 16 mg/dL (ref 7–18)
Calcium, Total: 7.7 mg/dL — ABNORMAL LOW (ref 8.5–10.1)
EGFR (African American): 46 — ABNORMAL LOW
Glucose: 65 mg/dL (ref 65–99)
Osmolality: 279 (ref 275–301)
Potassium: 3.4 mmol/L — ABNORMAL LOW (ref 3.5–5.1)
Sodium: 140 mmol/L (ref 136–145)

## 2013-04-04 LAB — BASIC METABOLIC PANEL
Anion Gap: 4 — ABNORMAL LOW (ref 7–16)
Calcium, Total: 7.4 mg/dL — ABNORMAL LOW (ref 8.5–10.1)
Chloride: 105 mmol/L (ref 98–107)
Co2: 30 mmol/L (ref 21–32)
Creatinine: 1.36 mg/dL — ABNORMAL HIGH (ref 0.60–1.30)
EGFR (Non-African Amer.): 56 — ABNORMAL LOW
Osmolality: 278 (ref 275–301)
Potassium: 3.6 mmol/L (ref 3.5–5.1)

## 2013-04-08 ENCOUNTER — Ambulatory Visit: Payer: Self-pay | Admitting: Internal Medicine

## 2013-05-09 ENCOUNTER — Ambulatory Visit: Payer: Self-pay | Admitting: Internal Medicine

## 2013-05-09 DEATH — deceased

## 2014-10-16 IMAGING — CR DG CHEST 1V PORT
1 series · 1 of 1 positions shown · non-contrast
Comparison: none

REASON FOR EXAM: SOB
COMMENTS:

[ap]
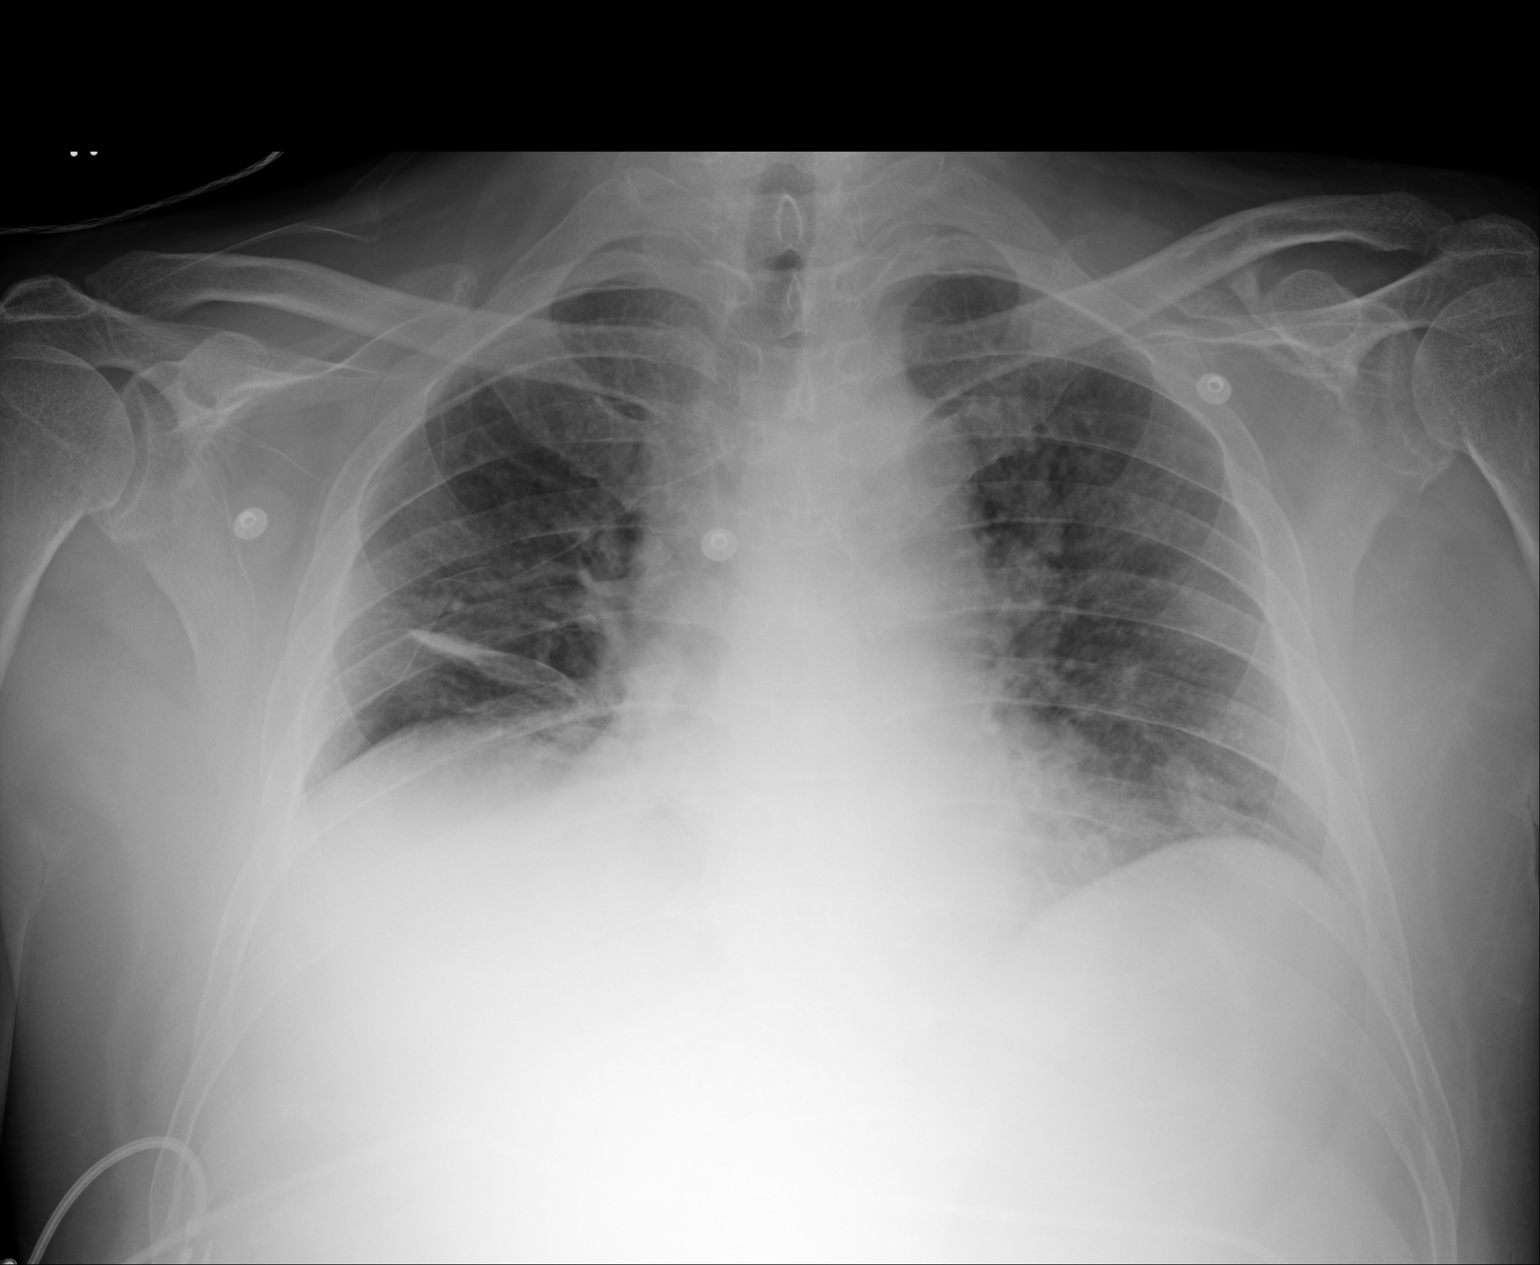

[1 of 1 positions shown; findings below may reference images not displayed]

PROCEDURE:     DXR - DXR PORTABLE CHEST SINGLE VIEW  - March 12, 2013 [DATE]

RESULT:     Comparison is made to the study March 24, 2012.

The lungs are hypoinflated. There is atelectasis in the inferior aspect of
the right lung. The cardiac silhouette is enlarged but somewhat ill-defined.
The pulmonary vascularity is indistinct.
IMPRESSION: The findings are consistent with congestive heart failure.
Superimposed hypoinflation accentuates the x-ray findings. A followup PA and
lateral chest x-ray would be of value.

[REDACTED]

## 2014-10-23 IMAGING — CR DG CHEST 1V PORT
1 series · 1 of 1 positions shown · non-contrast
Comparison: none

REASON FOR EXAM: Chest pain
COMMENTS:

PROCEDURE:     DXR - DXR PORTABLE CHEST SINGLE VIEW  - March 19, 2013  [DATE]
RESULT:     Comparison: 03/15/2013

[ap]
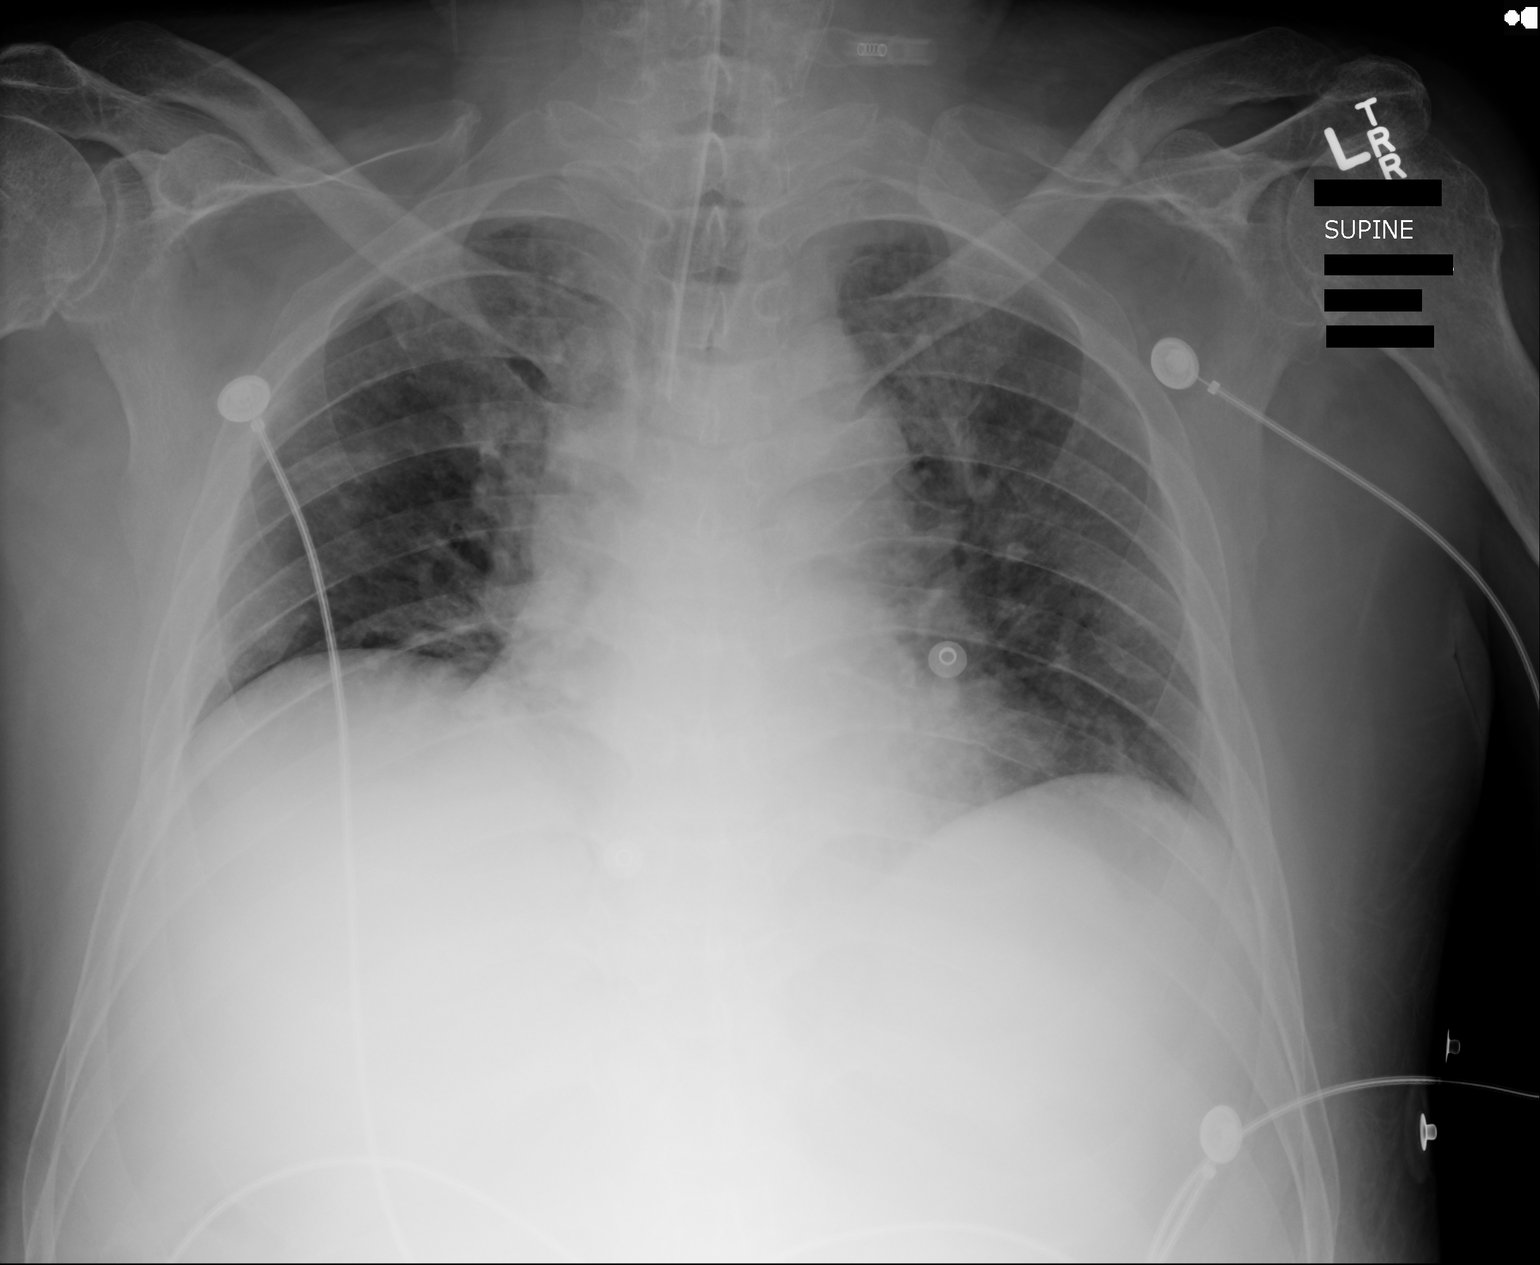

[1 of 1 positions shown; findings below may reference images not displayed]

FINDINGS: The lung volumes are low. The endotracheal tube terminates in the mid
intrathoracic trachea. The heart and mediastinum are stable. Mild bilateral
interstitial opacities are likely secondary to the low lung volumes.
IMPRESSION: 1. Endotracheal tube terminates in the mid intrathoracic trachea.
2. Mild interstitial opacities are likely secondary to the low lung volumes.
Infection or interstitial edema would be difficult to exclude.

[REDACTED]

## 2014-10-23 IMAGING — CR DG CHEST 1V PORT
1 series · 1 of 1 positions shown · non-contrast
Comparison: none

REASON FOR EXAM: post central line placement
COMMENTS:

PROCEDURE:     DXR - DXR PORTABLE CHEST SINGLE VIEW  - March 19, 2013 [DATE]
RESULT:     Comparison: Earlier same day.

[ap]
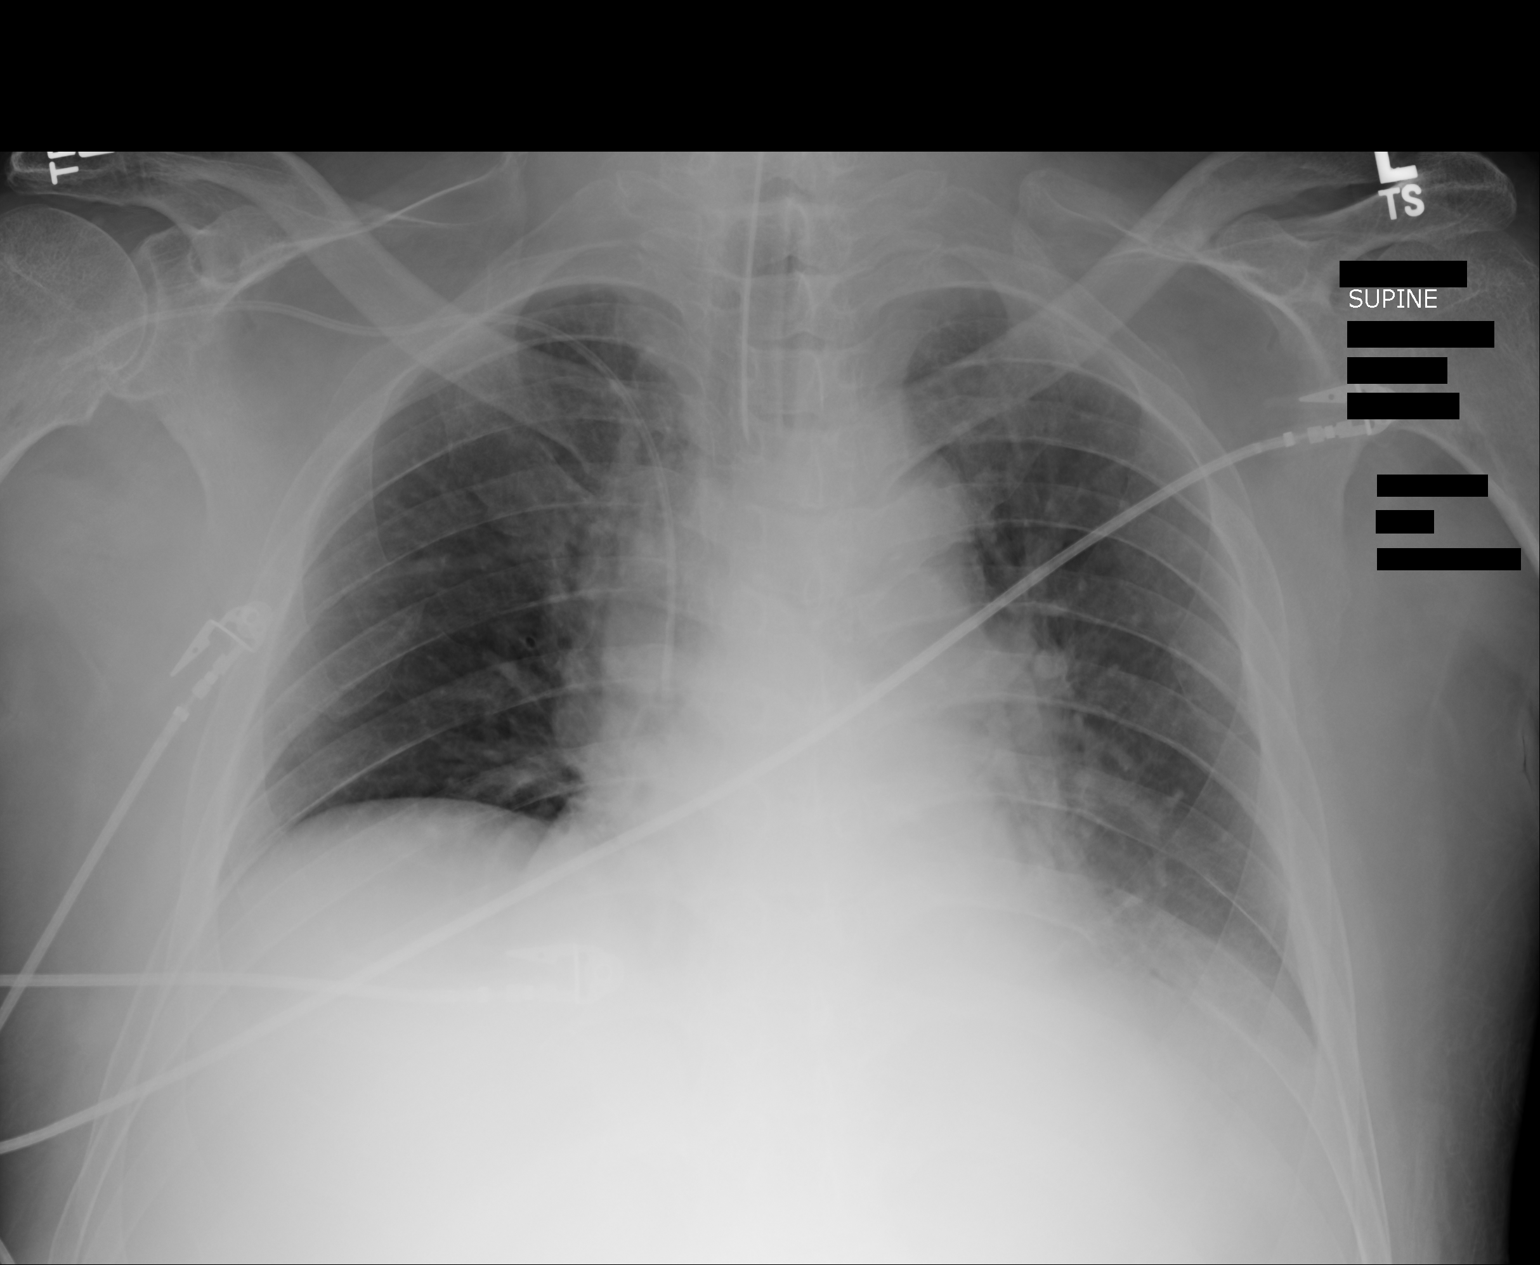

[1 of 1 positions shown; findings below may reference images not displayed]

FINDINGS: Right subclavian central venous catheter tip overlies the cavoatrial
junction. No pneumothorax. Endotracheal tube terminates in the intrathoracic
trachea. Heart and mediastinum are stable. The lung volumes are low. Mild
interstitial opacities in the left lung are likely secondary to atelectasis.
IMPRESSION: 1. Right subclavian central venous catheter tip overlies the cavoatrial
junction.
2. Mild interstitial opacities in the left lung may be secondary to the low
lung volumes. Asymmetric edema would be of differential consideration.

[REDACTED]

## 2014-10-24 IMAGING — CT CT ABD-PELV W/ CM
1 of 3 series · 14 of 32 positions shown, 19 images · non-contrast
Comparison: none

REASON FOR EXAM: (1) abdominal distension, fever,; (2) abdominal
distension, fever,
COMMENTS:

PROCEDURE:     CT  - CT ABDOMEN / PELVIS  W  - March 20, 2013 [DATE]
RESULT:     History: Abdominal distention and fever.
Comparison Study: Prior CT of 09/18/2012.

[Series 2: 3mm soft tissue · axial · 0.82mm/px · z∈[+659,+1163]mm · 14 of 192 slices shown, 19 images]
[im 12/192  soft-tissue]
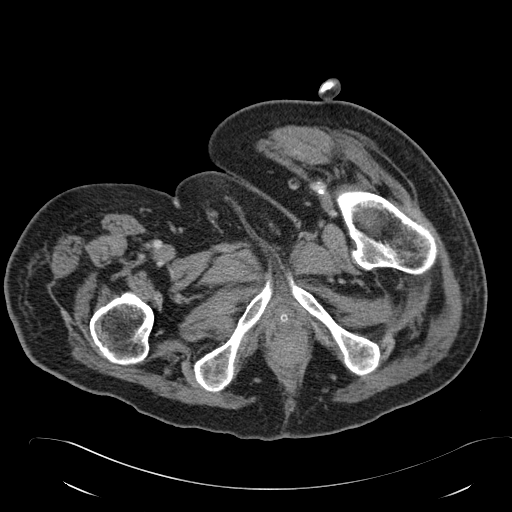
[im 12/192  bone]
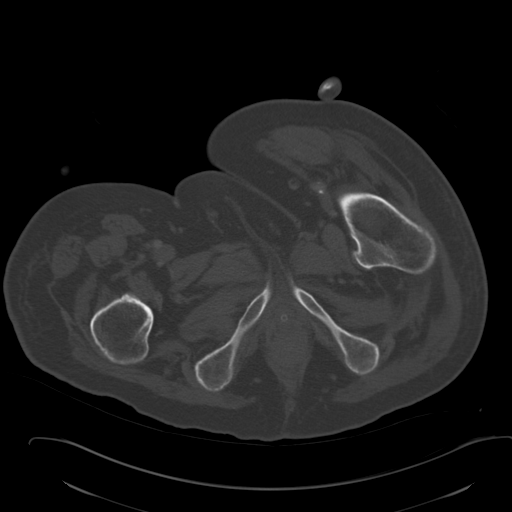
[im 23/192  soft-tissue]
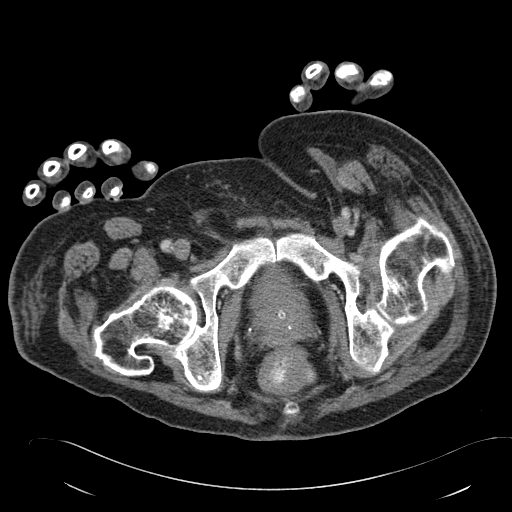
[im 45/192  soft-tissue]
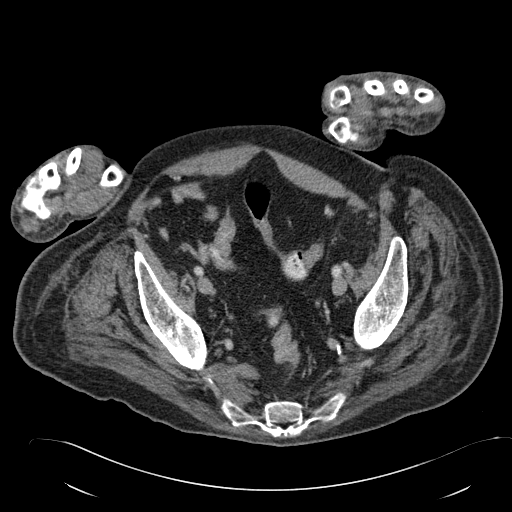
[im 57/192  soft-tissue]
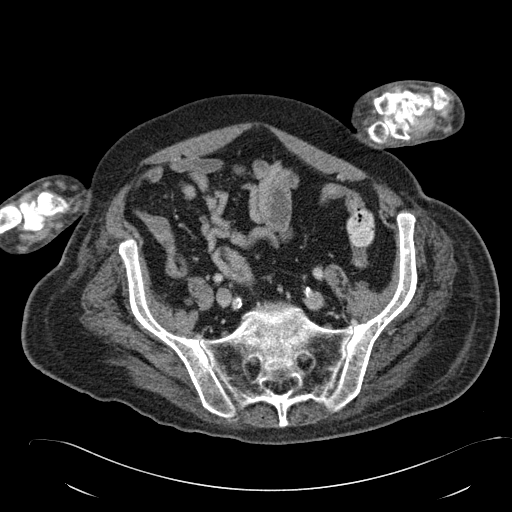
[im 68/192  soft-tissue]
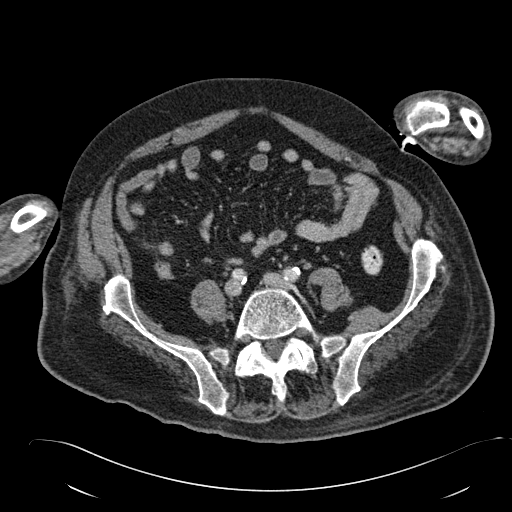
[im 79/192  soft-tissue]
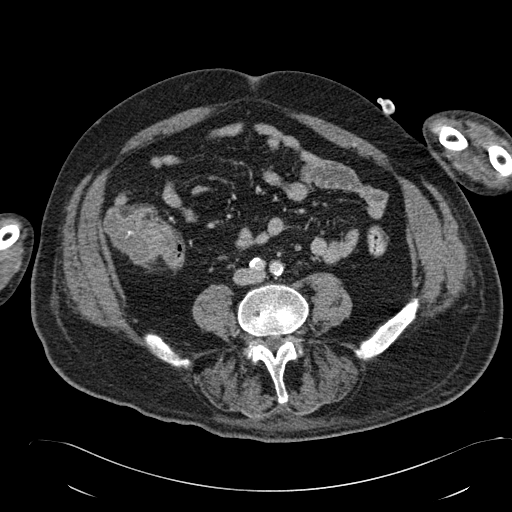
[im 102/192  soft-tissue]
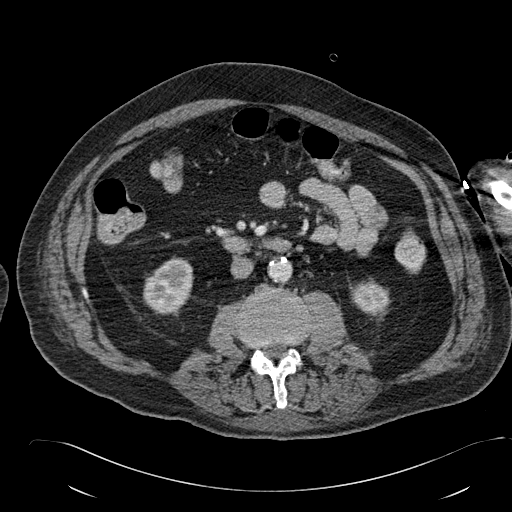
[im 113/192  soft-tissue]
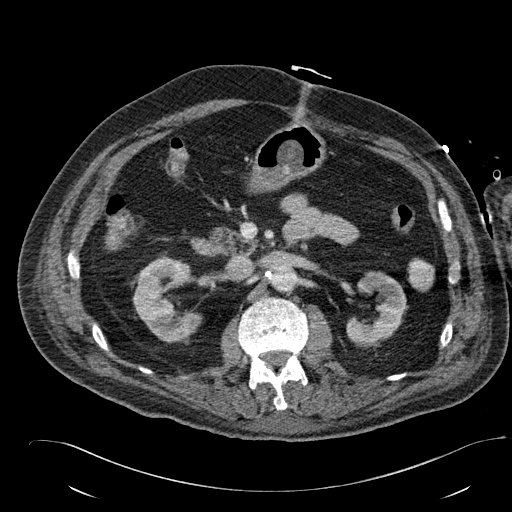
[im 124/192  soft-tissue]
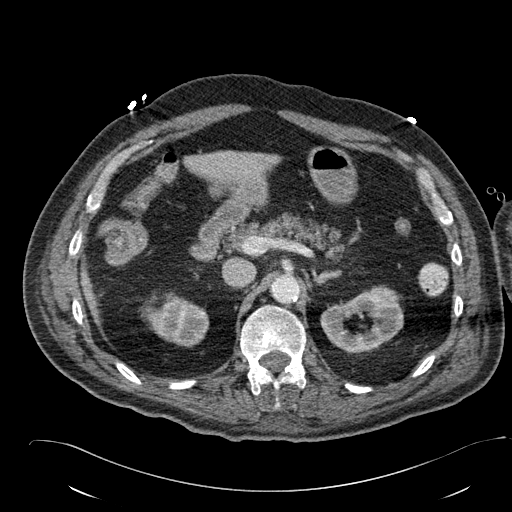
[im 124/192  bone]
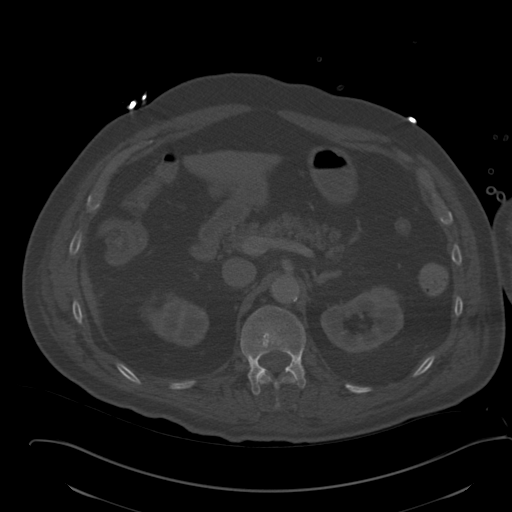
[im 135/192  soft-tissue]
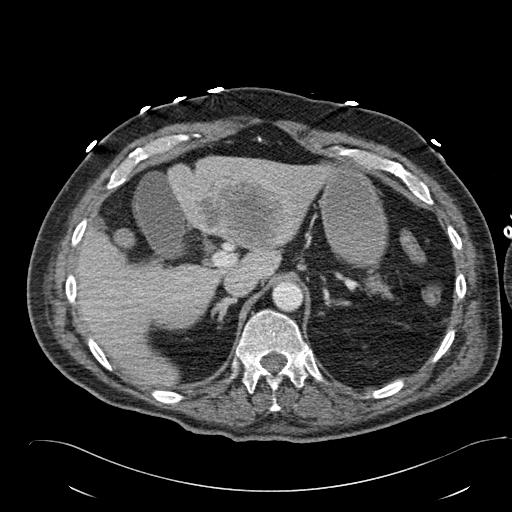
[im 147/192  soft-tissue]
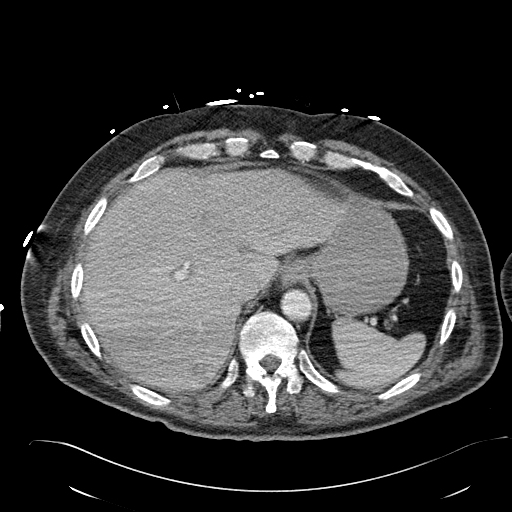
[im 147/192  lung]
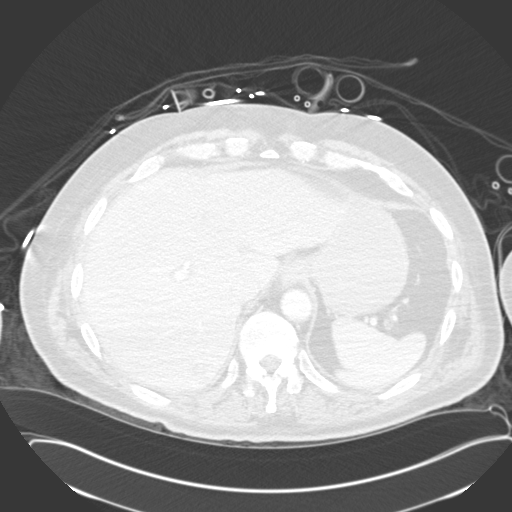
[im 158/192  lung]
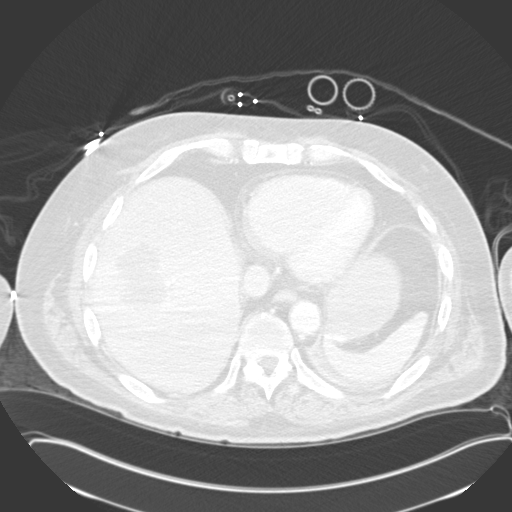
[im 169/192  soft-tissue]
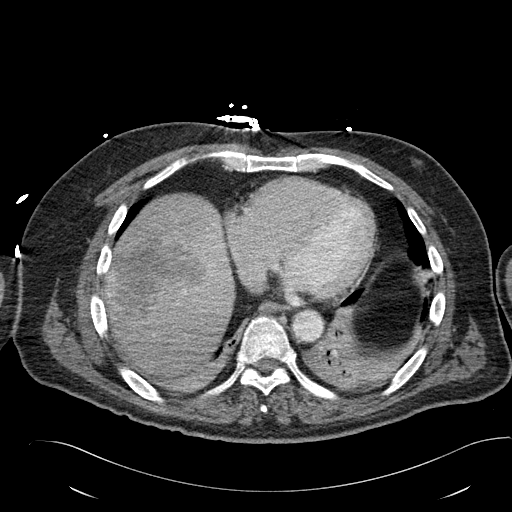
[im 169/192  lung]
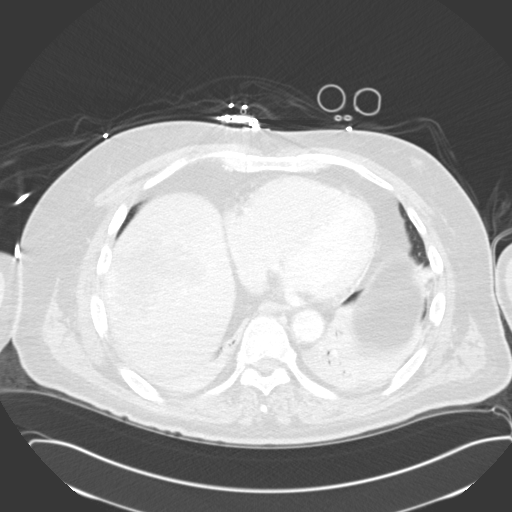
[im 180/192  soft-tissue]
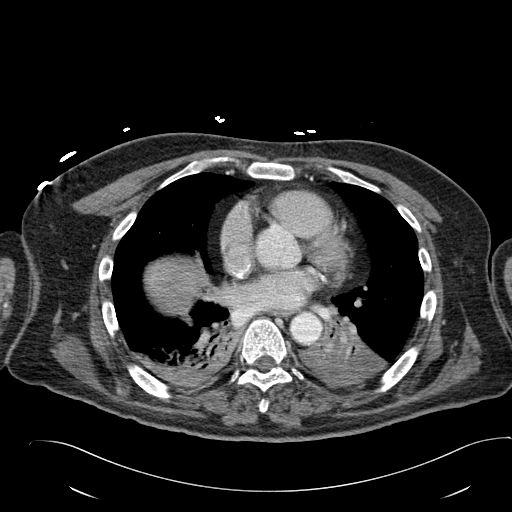
[im 180/192  lung]
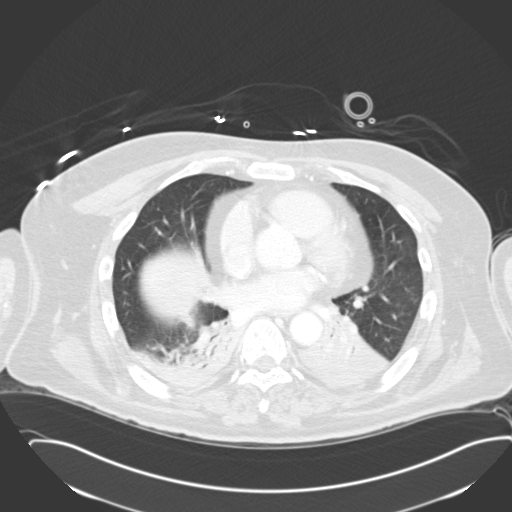

[14 of 32 positions shown; findings below may reference images not displayed]

FINDINGS: Standard CT of the abdomen and pelvis obtained with one cc of
[id number]. Multiple large hepatic lesions are again noted consistent with
metastatic disease. These have progressed significantly. The largest in the
right lobe of the liver 6.2 cm in maximum diameter. Gallbladder there system
unremarkable. Pancreas unremarkable. Spleen unremarkable. Adrenals normal.
Kidneys unremarkable. No hydronephrosis. A gastrostomy tube is present.
There is no bowel distention. No inflammatory change in right or left lower
quadrant. Cecal wall is thickened. Cecal malignancy cannot be excluded.
Bladder wall severely thickened. Foley catheter is present within the
bladder. Atelectasis and/or pneumonia in the lower lobes and bilateral
pleural effusions. Chronic changes are noted in the femoral heads ,these
changes could be related to avascular necrosis, the possibility of
metastatic disease cannot be excluded. The coronary artery disease.
IMPRESSION: 1. Severe progression of hepatic metastatic disease.
2. Thickened thickening of the bladder wall. Bladder malignancy could
present this fashion. Cystitis could present this fashion.
3. Thickened cecal wall. Cecal malignancy could present this fashion.
4. Bibasilar pneumonia and/or atelectasis.
5. Gastrostomy tube and Foley catheter in good position.

## 2015-01-26 NOTE — Consult Note (Signed)
Brief Consult Note: Diagnosis: masses in liver, chronic anemia, elevated cea.   Patient was seen by consultant.   Discussed with Attending MD.   Comments: Patient seen on 12/14, chart reviewed.  Dictated note to folow. Anemia, chronic, consistent with acute infection and chronic disease, is improved from hgb 1 year ago. CT densities in liver, and mild elevated CEA. Significant comorbidity. Plan of approach pending meeting with wife and HCPOA, one approach would be a Pet/ct, and bx most accessable positive lesion, alternatively f/u cea and CT , and possible role for colonoscopy. Will f/u later today.  Electronic Signatures: Marin RobertsGittin, Zaviyar Rahal G (MD)  (Signed 15-Dec-13 08:20)  Authored: Brief Consult Note   Last Updated: 15-Dec-13 08:20 by Marin RobertsGittin, Krista Som G (MD)

## 2015-01-26 NOTE — Consult Note (Signed)
Chief Complaint:   Subjective/Chief Complaint Liver mass, elevated CEA, chronic anemia   VITAL SIGNS/ANCILLARY NOTES: **Vital Signs.:   17-Dec-13 21:16   Vital Signs Type Routine   Temperature Temperature (F) 99.5   Celsius 37.5   Temperature Source AdultAxillary   Pulse Pulse 74   Respirations Respirations 19   Systolic BP Systolic BP 656   Diastolic BP (mmHg) Diastolic BP (mmHg) 76   Mean BP 88   Pulse Ox % Pulse Ox % 95   Pulse Ox Activity Level  At rest   Oxygen Delivery Room Air/ 21 %   Brief Assessment:   Respiratory no use of accessory muscles    Gastrointestinal details normal Nontender    Additional Physical Exam alert, seems to understand , but speech impaired, can not have a conversation, no cough, no signs distress   Lab Results: Hepatic:  17-Dec-13 04:09    Bilirubin, Total  0.1   Alkaline Phosphatase 115   SGPT (ALT) 13   SGOT (AST) 16   Total Protein, Serum 6.8   Albumin, Serum  2.3  Routine Chem:  17-Dec-13 04:09    Glucose, Serum  122   BUN 11   Creatinine (comp) 0.92   Sodium, Serum 139   Potassium, Serum 3.8   Chloride, Serum 107   CO2, Serum 25   Calcium (Total), Serum 8.7   Osmolality (calc) 278   eGFR (African American) >60   eGFR (Non-African American) >60 (eGFR values <33m/min/1.73 m2 may be an indication of chronic kidney disease (CKD). Calculated eGFR is useful in patients with stable renal function. The eGFR calculation will not be reliable in acutely ill patients when serum creatinine is changing rapidly. It is not useful in  patients on dialysis. The eGFR calculation may not be applicable to patients at the low and high extremes of body sizes, pregnant women, and vegetarians.)   Anion Gap 7  Routine Hem:  17-Dec-13 04:09    WBC (CBC)  14.1   RBC (CBC)  3.47   Hemoglobin (CBC)  9.1   Hematocrit (CBC)  27.8   Platelet Count (CBC) 346   MCV 80   MCH 26.3   MCHC 32.7   RDW  15.5   Neutrophil % 74.7   Lymphocyte % 11.7    Monocyte % 10.3   Eosinophil % 2.4   Basophil % 0.9   Neutrophil #  10.5   Lymphocyte # 1.6   Monocyte #  1.5   Eosinophil # 0.3   Basophil # 0.1 (Result(s) reported on 24 Sep 2012 at 04:37AM.)   Assessment/Plan:  Assessment/Plan:   Assessment As prior discussed with wife and patient, and reviwed today with Dr WEarleen Newport will proceed to PET/CT as family is considering agressive tx..Marland KitchenMarland Kitchenf evidence supports  malignancy will look at bx, later port placement and tx, alternatively serial scanning until progression and then tx at later time   Electronic Signatures: GDallas Schimke(MD)  (Signed 17-Dec-13 21:40)  Authored: Chief Complaint, VITAL SIGNS/ANCILLARY NOTES, Brief Assessment, Lab Results, Assessment/Plan   Last Updated: 17-Dec-13 21:40 by GDallas Schimke(MD)
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
## 2015-01-29 NOTE — Consult Note (Signed)
Chief Complaint:  Subjective/Chief Complaint seen for abdominal distension.  emesis episode noted earlier today with high residuals of TF. no response to examiner.   VITAL SIGNS/ANCILLARY NOTES: **Vital Signs.:   23-Jun-14 17:00  Vital Signs Type Routine  Temperature Temperature (F) 97.1  Celsius 36.1  Temperature Source axillary  Pulse Pulse 68  Respirations Respirations 30  Systolic BP Systolic BP 390  Diastolic BP (mmHg) Diastolic BP (mmHg) 80  Mean BP 91  Pulse Ox % Pulse Ox % 97  Oxygen Delivery Ventilator Assisted  Pulse Ox Heart Rate 70  Access Pressure -81  Venous Pressure 142  Effluent Pressure 216  Balance Chamber Pressure 240  Therapy Fluid Rate (L/hr) 2.5  Blood Flow Rate (ml/min) 300  Ultrafiltration Rate (mL/hr) 75    18:00  Vital Signs Type Routine  Temperature Source axillary  Pulse Pulse 68  Respirations Respirations 19  Systolic BP Systolic BP 300  Diastolic BP (mmHg) Diastolic BP (mmHg) 79  Mean BP 92  Pulse Ox % Pulse Ox % 99  Oxygen Delivery Ventilator Assisted  Pulse Ox Heart Rate 68  Therapy Fluid Rate (L/hr) 2.5  Blood Flow Rate (ml/min) 300  Ultrafiltration Rate (mL/hr) 75   Brief Assessment:  Cardiac Regular   Respiratory rhonchi   Gastrointestinal details normal distended, tense.  no evidence tf pain to exam.   Lab Results: TDMs:  23-Jun-14 11:43   Gentamicin, Random 2.5 (Result(s) reported on 31 Mar 2013 at 12:54PM.)  Routine Chem:  23-Jun-14 04:19   Glucose, Serum 90  BUN  39  Creatinine (comp)  3.34  Sodium, Serum  134  Potassium, Serum 3.6  Chloride, Serum 101  CO2, Serum 25  Calcium (Total), Serum  7.8  Anion Gap 8  Osmolality (calc) 277  eGFR (African American)  22  eGFR (Non-African American)  19 (eGFR values <40mL/min/1.73 m2 may be an indication of chronic kidney disease (CKD). Calculated eGFR is useful in patients with stable renal function. The eGFR calculation will not be reliable in acutely ill patients when  serum creatinine is changing rapidly. It is not useful in  patients on dialysis. The eGFR calculation may not be applicable to patients at the low and high extremes of body sizes, pregnant women, and vegetarians.)  Result Comment labs - This specimen was collected through an   - indwelling catheter or arterial line.  - A minimum of 31mls of blood was wasted prior    - to collecting the sample.  Interpret  - results with caution.  Result(s) reported on 31 Mar 2013 at 04:36AM.  Magnesium, Serum 2.0 (1.8-2.4 THERAPEUTIC RANGE: 4-7 mg/dL TOXIC: > 10 mg/dL  -----------------------)  Phosphorus, Serum 4.0 (Result(s) reported on 31 Mar 2013 at 04:29AM.)    11:43   Glucose, Serum  106  BUN  31  Creatinine (comp)  2.63  Sodium, Serum  134  Potassium, Serum 3.6  Chloride, Serum 100  CO2, Serum 27  Calcium (Total), Serum  8.3  Anion Gap 7  Osmolality (calc) 275  eGFR (African American)  29  eGFR (Non-African American)  25 (eGFR values <85mL/min/1.73 m2 may be an indication of chronic kidney disease (CKD). Calculated eGFR is useful in patients with stable renal function. The eGFR calculation will not be reliable in acutely ill patients when serum creatinine is changing rapidly. It is not useful in  patients on dialysis. The eGFR calculation may not be applicable to patients at the low and high extremes of body sizes, pregnant women, and  vegetarians.)  Routine Hem:  23-Jun-14 04:19   WBC (CBC)  14.5  RBC (CBC)  3.34  Hemoglobin (CBC)  7.3  Hematocrit (CBC)  23.0  Platelet Count (CBC) 365  MCV  69  MCH  21.9  MCHC  31.8  RDW  25.2  Neutrophil % 85.7  Lymphocyte % 2.9  Monocyte % 9.0  Eosinophil % 2.2  Basophil % 0.2  Neutrophil #  12.4  Lymphocyte #  0.4  Monocyte #  1.3  Eosinophil # 0.3  Basophil # 0.0 (Result(s) reported on 31 Mar 2013 at 04:29AM.)   Assessment/Plan:  Assessment/Plan:  Assessment 1) abdominal distension-no ileus on films. intolerance of high volume  residual feed.   2) probable metastatic colon cancer. Possibility of some amount of peritoneal carcinomatosis affecting abd fluid and distension.   3) Aspiration PNA-improving.   Plan 1) repeat abd films in am 2) use about 200-250 ml residual for benchmark residuals on tf.  discussed with nursing.   Electronic Signatures: Loistine Simas (MD)  (Signed 23-Jun-14 19:48)  Authored: Chief Complaint, VITAL SIGNS/ANCILLARY NOTES, Brief Assessment, Lab Results, Assessment/Plan   Last Updated: 23-Jun-14 19:48 by Loistine Simas (MD)

## 2015-01-29 NOTE — Consult Note (Signed)
Chief Complaint:  Subjective/Chief Complaint seen for abdominal distension, minimal response to examiner.  no further emesis.   VITAL SIGNS/ANCILLARY NOTES: **Vital Signs.:   25-Jun-14 07:54  Temperature Temperature (F) 98.4  Celsius 36.8  Temperature Source oral    11:29  Temperature Temperature (F) 98.9  Celsius 37.1  Temperature Source oral    12:00  Pulse Pulse 76  Respirations Respirations 17  Systolic BP Systolic BP 140  Diastolic BP (mmHg) Diastolic BP (mmHg) 76  Mean BP 97  Pulse Ox % Pulse Ox % 97  Pulse Ox Activity Level  At rest  Oxygen Delivery Ventilator Assisted  Pulse Ox Heart Rate 78    13:00  Pulse Pulse 78  Respirations Respirations 23  Systolic BP Systolic BP 161  Diastolic BP (mmHg) Diastolic BP (mmHg) 90  Mean BP 113  Pulse Ox % Pulse Ox % 96  Pulse Ox Activity Level  At rest  Oxygen Delivery Ventilator Assisted  Pulse Ox Heart Rate 78   Brief Assessment:  Cardiac Irregular   Respiratory rhonchi   Gastrointestinal details normal tense distension, powel sounds positive   Lab Results: Hepatic:  25-Jun-14 05:10   Bilirubin, Total 0.5  Bilirubin, Direct < 0.05  Alkaline Phosphatase 90  SGPT (ALT) 58  SGOT (AST)  711  Total Protein, Serum  5.5  Albumin, Serum  1.5  Routine Chem:  25-Jun-14 05:10   Result Comment LIVER PANEL - SPECIMEN IS SEVERELY HEMOLYZED  - INTERPRET RESULTS WITH CAUTION  Result(s) reported on 02 Apr 2013 at 10:15AM.  Result Comment CBC - CANNOT REPORT RBC,HCT,MCV,MCH,MCHC DUE  - TO EXTREME HEMOLYSIS. DIFFERENTIAL - AUTOMATED DIFFERENTIAL VERIFIED BY  - PERIPHERAL SMEAR EXAM. RESULTS ARE  - CONMSISTENT WITH AUTOMATED DIFF..TPL  Result(s) reported on 02 Apr 2013 at 06:27AM.  Result Comment CHEMISTRIES - EXTREME HEMOLYSIS DUE TO RENAL EXCHANGE  - INTERPRET RESULTS WITH CAUTION  Result(s) reported on 02 Apr 2013 at 05:34AM.  Magnesium, Serum  1.5 (1.8-2.4 THERAPEUTIC RANGE: 4-7 mg/dL TOXIC: > 10 mg/dL   -----------------------)  Phosphorus, Serum  1.4 (Result(s) reported on 02 Apr 2013 at 05:35AM.)  Glucose, Serum 71  BUN 15  Creatinine (comp) 1.26  Sodium, Serum 139  Potassium, Serum 3.9  Chloride, Serum 105  CO2, Serum 28  Calcium (Total), Serum  7.8  Anion Gap  6  Osmolality (calc) 277  Routine Hem:  25-Jun-14 05:10   WBC (CBC)  21.3  RBC (CBC) See Comment  Hemoglobin (CBC)  7.4  Hematocrit (CBC) See Comment  Platelet Count (CBC) 341  MCV See Comment  MCH See Comment  MCHC See Comment  RDW  24.4  Neutrophil % 84.1  Lymphocyte % 2.3  Monocyte % 11.5  Eosinophil % 1.8  Basophil % 0.3  Neutrophil #  17.9  Lymphocyte #  0.5  Monocyte #  2.4  Eosinophil # 0.4  Basophil # 0.1   Radiology Results: CT:    24-Jun-14 14:36, CT Abdomen and Pelvis Without Contrast  CT Abdomen and Pelvis Without Contrast   REASON FOR EXAM:    (1) sig abd distention; (2) sig abd distention  COMMENTS:       PROCEDURE: CT  - CT ABDOMEN AND PELVIS W0  - Apr 01 2013  2:36PM     RESULT: Axial noncontrast CT scanning was performed through the abdomen   and pelvis with reconstructions at 3 mm intervals and slice thicknesses.   Comparison is made to the contrast enhanced study of March 20, 2013.  Since the previous study there have developed small bilateral pleural   effusions. There remains bibasilar atelectasis and/or pneumonia. The   cardiac chambers are not enlarged. There are coronary artery   calcifications    The liver is normal in contour. The known abnormal masses in the right     and left lobes are poorly defined on this noncontrast exam. There is no   intrahepatic ductal dilation. The gallbladder is mildly distended but   exhibits no evidence of stones or surrounding inflammatory change. The   pancreas, spleen, nondistended stomach, adrenal glands, and kidneys   exhibit no acute abnormality. A PEG tube is present and appears to be in   reasonable position in the gastric  antrum.    The unopacified loops of small and large bowel exhibit no evidence of   ileus nor of obstruction. There is a moderate amount of free pelvic   fluid. No free extraluminal gas collections are demonstrated. A structure   is present which may reflect an uninflamed appendix. The caliber of the   abdominal aorta is normal. There is no periaortic or pericaval   lymphadenopathy. There is no evidence of a psoas abscess. The urinary   bladder is decompressed with a Foley catheter. Again demonstrated is   bladder wall thickening which is not as well-defined given the relatively     nondistended state of the bladder.    Since the previous study increased subcutaneous density bilaterally   consistent with anasarca has progressed. This is likely responsible for   the apparent abdominal distention rather than space-occupying processes   within the peritoneal cavity.    IMPRESSION:   1. Since the previous study there has developed free pelvic fluid. Some   lies with between bowel loops while additional fluid lies in the   prerectal space.  2. Increased subcutaneous fat density diffusely has developed consistent   with progressive anasarca.  3. There are small bilateral pleural effusions. There is persistent   bibasilar atelectasis.  4. Known hepatic parenchymal masses are less well demonstrated today but   subtle abnormalities remain in both the right and left hepatic lobes. The   gallbladder remains mildly distended.  5. There is no evidence of a small or large bowel obstruction or ileus.   There is no evidence of enteritis or colitis. The cecum is somewhat less   conspicuous today but the possibility of underlying malignancy here is   not excluded.  6. The urinarybladder wall remains thickened.    Dictation Site: 1        Verified By: DAVID A. SwazilandJORDAN, M.D., MD   Assessment/Plan:  Assessment/Plan:  Assessment 1) abdominal distension.  ct not indicating either intraluminal  distension or bowel wall edema. probably from anasarca.  Patietn hypoalbuminemic.  2) c. diff- 10 day course of flagyl completed today.   Plan 1) consider albumin to change starling dynamics.  discussed with Dr Cherylann RatelLateef and Dr Jacques NavyAhmadzia.   2) repeat c diff pcr in 3-5 days.   Electronic Signatures: Barnetta ChapelSkulskie, Martin (MD)  (Signed 25-Jun-14 15:56)  Authored: Chief Complaint, VITAL SIGNS/ANCILLARY NOTES, Brief Assessment, Lab Results, Radiology Results, Assessment/Plan   Last Updated: 25-Jun-14 15:56 by Barnetta ChapelSkulskie, Martin (MD)

## 2015-01-29 NOTE — Consult Note (Signed)
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    He is afebrile and is currently oxygenating well.  On admission, he was hypoxic and has leukocytosis.  His CXR does not show aspiration, but rather CHF.  While this would explain his hypoxia, it would not explain the leukocytosis.  It is possible that he had mucous plugging or an aspiration event.    Most aspiration is a chemical pneumonitis rather than an infectious process.  No antibiotics are indicated unless abscess or severe respiratory failure ensues.    Will stop his antibiotics.    Follow his CBC.    Given his neurologic condition, he is at high risk for aspiration.  He currently has a PEG tube and gets intermittent bolus feedings.  If he has further aspiration events, then consideration of conversion to a GJ tube and continuous j-tube feeding. 6)     His past resistant organisms found in the urine are not pertinent to his current situation.  No isolation is needed unless he has had more recent cultures showing resistant organisms. 7)     His past resistant organisms found in the urine are not pertinent to his current situation.  No isolation is needed unless he has had more recent cultures showing resistant organisms.  8)  His u/a had signicant epithelial cells suggestive of contamination.  Given his neurologic condition, unsure if the pyuria is significant.  A UTI would not cause him to have hypoxia.   Electronic Signatures: Makeisha Jentsch MPH, Rosalyn GessMichael E (MD) (Signed on 05-Jun-14 15:40)  Authored   Last Updated: 05-Jun-14 16:07 by Sirius Woodford MPH, Rosalyn GessMichael E (MD)

## 2015-01-29 NOTE — Op Note (Signed)
PATIENT NAME:  [name], [name] MR#:  [id number] DATE OF BIRTH:  01/23/1951  DATE OF OPERATION:  03/30/2013  PREOPERATIVE DIAGNOSES: 1.  End-stage renal disease.  2. Hypotension, requiring continuous renal replacement therapy (CRRT), in need of temporary dialysis catheter.  3.  Quadriplegia.  4.  Respiratory failure.   POSTOPERATIVE DIAGNOSES:  1.  End-stage renal disease.  2. Hypotension, requiring continuous renal replacement therapy (CRRT), in need of temporary dialysis catheter.  3.  Quadriplegia.  4.  Respiratory failure.   PROCEDURES:  1.  Ultrasound guidance for vascular access, right jugular vein.  2.  Placement of 20 cm DuoGlide non-tunneled dialysis catheter, right jugular vein.   SURGEON: Dew.   ANESTHESIA: Local.   BLOOD LOSS: Minimal.   INDICATION FOR PROCEDURE: This is a gentleman who was admitted to the critical care unit in critical condition, hypotensive. He required CRRT for his renal failure and a catheter will have to be placed to do this. Consent was obtained from the wife.   DESCRIPTION OF PROCEDURE: The patient was laid flat in the critical care bed. Initially his right groin was sterilely prepped and draped, and no visualized vein that was useable was seen, and so I quickly abandoned this and turned my attention to the right jugular vein. The right internal jugular vein was visualized with ultrasound and found to be patent. It was accessed under direct ultrasound guidance without difficulty with a Seldinger needle. A J wire was placed after a skin nick and dilatation. A 20 cm long DuoGlide dialysis catheter was placed over the wire, and the wire was removed. Both lumens withdrew dark red, non-pulsatile blood and flushed easily with sterile saline. It was secured at 19 cm with 3 nylon sutures. A stat chest x-ray is pending.    ____________________________ Annice NeedyJason S. Dew, MD jsd:dm [name]: 03/30/2013 09:51:51 ET T: 03/30/2013 11:46:56 ET JOB#: [id number]  cc: Annice NeedyJason S.  Dew, MD, <Dictator> Annice NeedyJASON S DEW MD ELECTRONICALLY SIGNED 04/14/2013 13:20

## 2015-01-29 NOTE — Discharge Summary (Signed)
PATIENT NAME:  [name], [name] MR#:  [id number] DATE OF BIRTH:  Feb 15, 1951  DATE OF ADMISSION:  03/20/2013  ADMITTING PHYSICIAN:  Dr. Amado [name]  DATE OF DISCHARGE:  04/08/2013  DISCHARGING PHYSICIAN:  Dr. Enid Baasadhika Gottlieb [name]  PRIMARY ONCOLOGIST AND PRIMARY PHYSICIAN: Dr. Doylene [name]   CONSULTATIONS IN HOSPITAL:  1.  Pulmonary/Critical Care consultation by Dr. Meredeth [name] and Dr. Belia [name].  2.  Palliative Care consultation by Dr. Harriett SineNancy [name].  3.  ID consultation by Dr. Leavy [name].  4.  Nephrology consultation with Dr. Mady HaagensenMunsoor [name].  5.  GI consultation by Dr. Marva [name].   6.  Oncology consultation by Dr. Amalia [name].  7.  Vascular consultation by Dr. Festus BarrenJason [name].  8.  ENT consultation by Dr. Elenore [name]  DISCHARGE DIAGNOSES: 1.  Acute renal failure on CKD requiring hemodialysis.  2.  Septic shock secondary to multidrug-resistant Klebsiella pneumonia.  3.  Acute respiratory failure, status post tracheostomy on 03/26/2013.  4.  Atrial fibrillation with rapid ventricular response.  5.  Anasarca. 6.  Clostridium difficile colitis.  7.  Acute hemolytic anemia.  8.  Metastatic colon cancer with liver mets.  9.  Anemia of chronic disease.  10.  Seizure disorder.  11.  History of hemorrhagic cerebrovascular accident, bed-bound.  12.  Hypokalemia.  13.  Multiple electrolyte abnormalities.  14.  Low-grade disseminated intravascular coagulation.   DISCHARGE HOME MEDICATIONS:  1.  Glycopyrrolate 1 mg tablet - 2 tablets via G tube 3 times a day.  2.  Baclofen 20 mg 3 times a day via G tube.  3.  Keppra 5 mL twice a day (100 mg per mL).   4.  Tylenol suppository 650 mg every 4 hours as needed for pain or fever.  5. Ativan 0.5 mg 1 to 2 tablets oral or sublingual every 2 to 4 hours as needed for agitation/anxiety.  6.  Roxanol 20 mg per mL oral concentric 0.5 mL orally 1 to 2 hours as needed.  7.  Zofran oral disintegrating tablet 4 mg every 6 as needed for nausea and vomiting.   BRIEF HOSPITAL COURSE: Please see  the interim Discharge Summary dictated by Dr. Jacques [name] on 04/06/2013. For more details, please also see the History and Physical done by Dr. Amado [name] on 03/20/2013. In brief, [name] had a complicated hospital course. He was admitted for acute respiratory failure secondary to possible aspiration pneumonia. Cultures were growing multidrug-resistant Klebsiella. He has been on antibiotics gentamicin and polymyxin, which led to acute renal failure requiring continuous renal replacement therapy and hemodialysis. However, patient already had underlying stage IV colon cancer, and had poor prognosis. He had traumatic brain injury, subdural hemorrhage, status post surgery in the past, acute respiratory failure secondary to his pneumonia, requiring tracheostomy and being on vent. He has failed multiple trials of spontaneous weaning while in the hospital. He developed acute hemolytic anemia requiring transfusions, since he was becoming gravely ill and had been a very minimal chance of meaningful recovery. After palliative care consultation and multiple discussions with the family, wife has decided for terminal extubation, and patient was extubated on 04/05/2013, and has been on comfort measures. The patient is being transferred to hospice home at this time.   DISCHARGE CONDITION:  Guarded, with poor prognosis.   DISCHARGE DISPOSITION: To hospice home.   Time spent on discharge, additional time spent is 35 minutes.    ____________________________ [name] Baasadhika Kaisa Wofford, MD [name]: 04/08/2013 16:14:16 ET T: 04/08/2013 19:32:31 ET JOB#: [id number]  cc: [name] Baasadhika [name] Judon, MD, <Dictator> [name] BaasADHIKA [name] Marxen MD  ELECTRONICALLY SIGNED 05/05/2013 13:18

## 2015-01-29 NOTE — Consult Note (Signed)
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 diff colitis with respiratory failure.    He is currently critically ill in the CCU.  He is on the ventilator and is unresponsive.     His CXR is not extremely obvious for an infiltrate, but his symptoms were consistent with a pulmonary process and KPC was found in the sputum.is extremely difficult to treat with a high mortality rate and high rate of failure of therapy.  Most studies suggest two drugs.  He is currently only on gentamicin. Will add polymyxin.  This can have renal side effects.  Will monitor creatinine daily.   He also has C. diff colitis.  Agree with metronidazole via PEG.  Will likely need this for 10 days after completing his therapy for pneumonia.    Follow his CBC.    Continue vent support. 6)     Continue contact isolation.  Would maintain very strict contact as this organism is extremely difficult to treat.    Electronic Signatures: Avelina Mcclurkin MPH, Rosalyn GessMichael E (MD) (Signed on 16-Jun-14 13:30)  Authored   Last Updated: 16-Jun-14 13:39 by Cassadie Pankonin MPH, Rosalyn GessMichael E (MD)

## 2015-01-29 NOTE — Consult Note (Signed)
PATIENT NAME:  [name], [name] MR#:  [id number] DATE OF BIRTH:  Nov 17, 1950  DATE OF CONSULTATION:  03/20/2013  CONSULTING PHYSICIAN:  Simonne Come. Inez Pilgrim, MD  REPORT OF CONSULTATION:  Mr. Downie is a 64 year old patient, well-known to me from prior hospital evaluations and recent clinic visit. I saw him on the late afternoon/early evening of the 12th, and this narrative is delayed until the current time.   Mr. Miskell is known to me with a history of colon cancer with metastasis to liver on a clinical basis from previous CT appearances and CEA, and an upward trend in his CEA over time, but he has never had diagnostic procedure for tissue biopsy. He has never been a good candidate for, and he and his family have not wanted to start therapy. We have embraced watchful waiting since the diagnosis, first radiographically in December. The patient has had some rectal bleeding, but none recently. He has a lesion in the colon that has not re-bled. He has a CEA that is slowly going up. He has serial CT scans of the liver that shows some increase in lesions that are considered to be metastatic. He has a significant past medical history of traumatic brain injury, left hemiparesis, right side weakness, hypertension, hyperlipidemia, hypothyroidism, BPH, occipital and C1 fracture, chronic kidney disease. He has had multiple hospitalizations, and frequent infections and urinary tract infections, as well as aspiration. He was recently hospitalized again June 5 to June 10 with acute respiratory failure, second aspiration pneumonia, and E. coli septicemia.   MOST RECENT MEDICATIONS:  Synthroid 100 mcg daily, glycopyrrolate 1 mg 2 tabs 3 times a day, Protonix 40 daily, finasteride 5 mg daily, magnesium oxide 400 mg daily, Colace 10 mg b.i.d., iron 1 capsule b.i.d., dantrolene 50 mg t.i.d., MiraLAX daily, Baclofen 20 mg t.i.d., Tussin 10 mL b.i.d., Tylenol q. 6-4 hours p.r.n., Tylenol with oxycodone p.r.n., atorvastatin 10  mg daily, aspirin 81 mg daily, Keppra 500 mg b.i.d., Ceftin 500 mg b.i.d., planned a 10-day course.   The patient was just readmitted, went home a day. He became hypotensive, and is readmitted now with hypoxia, hypotension, systemic immune response syndrome. He is intubated and on pressors. He was febrile earlier in the day. Again, his vitals are maintained on pressors. He obviously cannot give a history. He has a trach. He is intubated, his rate is regular. His abdomen was nontender. He has a G tube in place. Chronically, there are no palpable lymph nodes in the neck, supraclavicular, submandibular. 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 Has symmetric extremity edema. His labs on June 11, liver functions, SGOT was 60, bilirubin was o.2, and alk phos was 133. His creatinine was 0.95. He has a CT scan that shows some thickening of the bladder wall consistent with cystitis or possibly malignancy and thickening of the cecal wall and areas of previous known abnormality consistent with a tumor, bibasilar pneumonia or atelectasis, and also progression of hepatic metastatic disease compared to the December 2013 scan. The hemoglobin is 7.3, consistent with some recent results, has been waxing and waning with anemia. Platelets were unremarkable. The white count is elevated at 16,000 to 18,000, consistent with some recent results.   IMPRESSION AND PLAN: Respiratory failure, likely pneumonia, shock and sepsis, on pressors, on respiratory support, on antibiotics. Treated by Medicine and Pulmonary. From the hematology point of view, we have recently shown that he has progressive disease in the liver attributed to metastatic disease. He has a primary lesion in  the cecum. He has not had a tissue diagnosis. It has been felt that watchful waiting was most appropriate, given his poor condition. The risk of biopsy, the risk of a colonoscopy, anticipated poor tolerance and limited, if any, benefit of any interventions.  The recent repeated infections since our last office visit are, I think, consistent with the wisdom of no aggressive interventions. The patient's wife is at the bedside and, of course, we had recently seen the patient in the clinic, but we again discussed the situation. He has also anemia of chronic disease, and some blood loss and phlebotomy effect of underlying malignancy. He likely has some iron depletion as well. I do not believe his current acute difficulties are directly related to cancer, although immune compromise from underlying malignancy and possibly bowel could be source of bacteria with this tumor. There is possibly a link. More likely, this sickness is independent of underlying cancer, which is probably still asymptomatic. Nothing acute or active to offer from Oncology. With respect to anemia he will be transfused p.r.n. We can look at repeating iron studies, potentially a dose of ferriheme if he is iron depleted might help to reduce transfusion requirement.    ____________________________ Simonne Come. Inez Pilgrim, MD rgg:mr D: 03/21/2013 19:13:36 ET T: 03/21/2013 21:29:22 ET JOB#: [id number]  cc: Simonne Come. Inez Pilgrim, MD, <Dictator> Dallas Schimke MD ELECTRONICALLY SIGNED 03/28/2013 14:06
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
## 2015-01-29 NOTE — Op Note (Signed)
PATIENT NAME:  [name], [name] MR#:  [id number] DATE OF BIRTH:  1951-02-22  DATE OF PROCEDURE:  03/26/2013  PREOPERATIVE DIAGNOSES: 1.  Prolonged intubation. 2.  Ventilator-dependent tracheostomy.   POSTOPERATIVE DIAGNOSES: 1.  Prolonged intubation. 2.  Ventilator-dependent tracheostomy.   PROCEDURE:  Tracheostomy.  SURGEON: Vernie MurdersPaul Oliwia Berzins, M.[name].   ANESTHESIA: General.   COMPLICATIONS:  None.  TOTAL ESTIMATED BLOOD LOSS:  Minimal.   PROCEDURE: The patient was brought to the Operating Room with an oral endotracheal tube in. He was given general anesthesia through that oral endotracheal tube. He was placed in a supine position. Shoulder roll to extend his neck slightly. The neck was prepped and marked over the previous scarring from a trach tube he had a couple of years ago that had healed. The area was then infiltrated with 3 mL of 1% Xylocaine with epinephrine 1:200,000 in the area. He was prepped and draped in a sterile fashion.   The incision was created at the trach site for removal of the eschar right in the midline. This was opened in the midline between the strap muscles on both sides. You could feel where the previous hole in the trach tube was. I bluntly dissected down into this area until I could open up the trach tube. There was lots of mucus that was lying there as this was above the cuff level in the trachea. As I opened it up, I suctioned all the mucous out, the opening was relatively small. It was very tight here. I dilated this up and got it to where it was about big enough for a #6 Shiley tube to go in. An 8 tube would have been too large for the hole without cutting more cartilage to open up the hole wider. The #6 tube was readied, cuff checked and deflated and lubricated lightly with some K-Y jelly. The oral endotracheal tube cuff was deflated and it was pulled back to just above the trach site. A #6 Shiley DCT tube was placed. The cuff was inflated and there is no sign of any  leaks in the cuff. This is attached to the anesthesia machine. He was given anesthesia through this. Oxygenation remained good.  The trach tube was then sutured to the skin using 3-0 nylon sutures. A sponge trach ties were placed around the neck. A trach dressing was trimmed and placed underneath the trach tube. The patient had very minimal bleeding at any time.  There were no complications. He was taken directly back to the Intensive Care Unit in satisfactory condition.      ____________________________ [name] CopaPaul H. Masayoshi Couzens, MD phj:ce [name]: 03/26/2013 12:38:51 ET T: 03/26/2013 15:38:52 ET JOB#: [id number]  cc: [name] CopaPaul H. Su Duma, MD, <Dictator> [name] CopaPAUL H Candance Bohlman MD ELECTRONICALLY SIGNED 03/31/2013 18:50

## 2015-01-29 NOTE — Discharge Summary (Signed)
PATIENT NAME:  [name], [name] MR#:  [id number] DATE OF BIRTH:  [birth year]  DATE OF ADMISSION:  09/18/2012 DATE OF DISCHARGE:  09/26/2012  PRIMARY CARE PHYSICIAN: Vale HavenJoseph Wilson, MD  ONCOLOGIST: Benita Gutterobert Gittin, MD (The patient has an appointment on Monday at 2:00 p.m.)    FINAL DIAGNOSES: 1. Clinical sepsis pneumonia likely aspiration.  2. Vomiting.  3. Likely metastatic colon cancer to the liver.  4. Hypertension.  5. Hyperlipidemia.  6. History of cerebrovascular accident.  7. Hypothyroidism.  8. Gastroesophageal reflux disease.   MEDICATIONS ON DISCHARGE: 1. Levothyroxine 100 mcg 1 tab via tube daily at 6:00 a.m.  2. Glycopyrrolate 1 mg 2 tabs via tube 3 times a day at 6:00 a.m., 2:00 p.m. and 10:00 p.m. 3. Protonix oral granules one packet via tube at 6:00 a.m.  4. Finasteride 5 mg daily at 8:00 a.m.  5. Magnesium oxide 400 mg one tablet daily. 6. Colace liquid 10 mL via tube twice a day. 7. Iron polysaccharide 1 capsule via tube twice a day. 8. Dantrolene 50 mg 3 times a day at 8:00 a.m., 1:00 p.m. and 5:00 p.m.  9. Tussin 100 mg/5 mL 10 mL via tube twice a day. 10. MiraLax 17 grams in 8 ounces liquid via tube daily.  11. Baclofen 20 mg 3 times a day at 9:00 a.m., 1:00 p.m. and 6:00 p.m.  12. Tylenol 2 tabs via tube every 4 hours as needed for pain.  13. Acetaminophen/oxycodone 5/325 mg 1 to 2 tabs every 4 hours as needed for pain.  14. Atorvastatin 10 mg half tablet via tube once a day. 15. Aspirin 81 mg 1 tablet once a day. (Hold aspirin starting Saturday's dose for possible upcoming biopsy) 16. Reglan 5 mg/mL 5 mL every 6 hours. 17. Augmentin 400/57 mg/5 mL, 5 mL 3 times a day until completed.   DIET: One can every 2 hours for 6 cans, flush with water, 8 ounces every 4 hours.   ACTIVITY: Activity as tolerated.   DISCHARGE FOLLOW-UP: Follow up with Dr. Lorre NickGittin on Monday at  2:00 p.m. and in 1 to 2 weeks with Vale HavenJoseph Wilson.   REASON FOR ADMISSION: The patient was  admitted 09/18/2012 and discharged 09/26/2012. The patient was admitted with high fever, dysuria, coughing and emesis. He was febrile, tachycardic and significant leukocytosis. Foley catheter was placed and some pus ended up coming out. He was given vancomycin and Zosyn in the Emergency Room, was switched over to ertapenem, given IV fluids.   LABORATORY AND RADIOLOGICAL DATA DURING HOSPITAL COURSE: Chest x-ray showed shallow inspiration, interstitial infiltrate left hemithorax possibly on the right.   Two sets of blood cultures were done. One showed Staphylococcus capitis and epidermidis which is a contamination with skin flora. The other one was negative.   Glucose 126, BUN 23, creatinine 0.99, sodium 139, potassium 4.7, chloride 104, CO2 27, calcium 9.0, alkaline phosphatase 201, ALT 17, AST 20 and albumin 2.9. White blood cell count 27.7, hemoglobin and hematocrit 10.6 and 33.9, and platelet count 356.   Urine culture was negative even though urinalysis showed 3+ leukocyte esterase and too many to count white blood cells.  CT scan of the abdomen and pelvis with contrast showed right lower lobe pneumonia, multiple foci and low attenuation of the liver concerning for underlying malignant process or metastatic disease. Thickened urinary bladder concerning for cystitis.   Repeat blood cultures were negative.   CEA 6.0. CA-19-9 is 17. Alpha-fetoprotein 3. CA-19-9 is 7.   Ultrasound of the  abdomen shows no hepatic masses seen on ultrasound.  Repeat chest x-ray on 09/23/2012 came back no acute disease.  White blood cell count upon discharge 14.5.  PET CT scan showed findings consistent with malignant disease in the right colon near the cecum, also in the right and left lobes of the liver in three focal locations. Abnormal localization in the nasopharynx, prevertebral soft tissues. Correlate with direct visualization. Mild intake in the right tonsillar region. Old infarct in the right base of the  brain   HOSPITAL COURSE PER PROBLEM LIST:  1. For the patient's clinical sepsis pneumonia, likely aspiration. The patient was started initially in the ER on vancomycin and Zosyn and then was switched over to ertapenem. I put the patient back on Zosyn and stopped the vancomycin and ertapenem and then upon discharge I completed the course of Levaquin. The patient's temperature curve took a long time to come down, had low-grade fevers even up until 12/15 and 09/23/2012 with temperatures of 99. The patient's white count, looking back at records, has always been elevated, but has never been lower than 14, so this may be his baseline upon discharge. It was very elevated at 27.7 when they came. Unfortunately nothing grew out of the cultures so I was treating for aspiration pneumonia.  2. Vomiting. He was vomiting up the PEG feeds so I believe this was an aspiration, seen on CT scan. I did give him Reglan to help things move through and he seemed to be tolerating the tube feeds. The wife felt more comfortable giving bolus feeds rather than continuous feeds so this was done upon discharge.  3. Likely metastatic colon cancer to the liver. Dr. Lorre Nick did see in consultation. PET scan showed light-up in the colon near the cecum, the liver and nasopharynx. Since the patient does take aspirin for stroke, this needs to be held prior to biopsy. The patient will follow up with Dr. Lorre Nick on Monday. He will set up an outpatient liver biopsy for the patient to confirm diagnosis. I explained to the patient and the patient's wife that any treatment to be given for this will be palliative meaning not curative just to slow the disease down. They want to do everything that is done. The patient is a FULL CODE.  4. Hypertension. Blood pressure upon discharge 124/80, stable.  5. Hyperlipidemia. He is on atorvastatin.  6. History of cerebrovascular accident. He is on aspirin, which will be held on Saturday for possible biopsy coming up  next week.  7. Hypothyroidism. He is on levothyroxine.       8. Gastroesophageal reflux disease. He is on Protonix.  TIME SPENT ON DISCHARGE: 40 minutes plus.  ____________________________ Herschell Dimes. Renae Gloss, MD rjw:sb [name]: 09/26/2012 16:23:32 ET T: 09/27/2012 08:13:49 ET JOB#: [id number]  cc: Herschell Dimes. Renae Gloss, MD, <Dictator> Knute Neu. Lorre Nick, MD Vale Haven, MD Salley Scarlet MD ELECTRONICALLY SIGNED 10/10/2012 12:38

## 2015-01-29 NOTE — Consult Note (Signed)
Chief Complaint:  Subjective/Chief Complaint unobtainable   VITAL SIGNS/ANCILLARY NOTES: **Vital Signs.:   24-Jun-14 17:15  Vital Signs Type 15 min Post Blood Start Time  Temperature Temperature (F) 96  Celsius 35.5  Temperature Source oral  Pulse Pulse 58  Respirations Respirations 12  Systolic BP Systolic BP 125  Diastolic BP (mmHg) Diastolic BP (mmHg) 75  Mean BP 91  BP Source  if not from Vital Sign Device non-invasive  Pulse Ox % Pulse Ox % 96  Oxygen Delivery Ventilator Assisted  Access Pressure -46  Venous Pressure 94  Effluent Pressure 143  Balance Chamber Pressure 172  Therapy Fluid Rate (L/hr) 2.5  Blood Flow Rate (ml/min) 300  Ultrafiltration Rate (mL/hr) 75   Brief Assessment:  Additional Physical Exam anasarca, on vent, sedated, no bleeding from iv sites, dialysis cath in neck, subclavian line   Lab Results: Routine BB:  24-Jun-14 12:31   Crossmatch Unit 1 Ready (Result(s) reported on 01 Apr 2013 at 01:42PM.)  Routine Chem:  24-Jun-14 08:48   Result Comment RBC,HCT,MCV,MCH,MCHC - UNABLE TO REPORT DUE TO EXTREME  - HEMOLYSIS.THIS SPECIMEN WAS A  - VENIPUNCTURE.  Result(s) reported on 01 Apr 2013 at 09:32AM.    12:31   Result Comment DAT - INCORRECT ORDER-SHOULD BE FOR NEONATAL  - PATIENTS ONLY.  Result(s) reported on 01 Apr 2013 at 02:58PM.   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 Renal failure on continuous dialysis. Today, drop in hgb, evidence of hemolysis, fdp up, ldh up, plts holding , no bleeding. CT abdo and pelvis no new pathology, coombs neg, no evience of innune hemolysis. Cause of hemolysis unclear, possibly drug related, no sign of immune mediated, possibly related to dialysis. DiSCUSSED WITH MEDICINE, DISCUSSED WITH NEPHROLOGY AND PATHOLOGY. PLAN TRANSFUSE FOR HGB BELOW 7, WATCH RESPONSE OF  HEMOGLOBIN, F/U LFT AND LDH   Electronic Signatures: Marin RobertsGittin, Robert G (MD)  (Signed 24-Jun-14 19:20)  Authored: Chief Complaint, VITAL SIGNS/ANCILLARY NOTES, Brief Assessment, Lab Results, Assessment/Plan   Last Updated: 24-Jun-14 19:20 by Marin RobertsGittin, Robert G (MD)

## 2015-01-29 NOTE — Discharge Summary (Signed)
PATIENT NAME:  [name], [name] MR#:  [id number] DATE OF BIRTH:  August 25, 1951  DATE OF ADMISSION:  03/13/2013 DATE OF DISCHARGE:  03/18/2013  For a detailed note, please take a look at the history and physical done on admission by Dr. Rudene Rearwish.   DISCHARGE DIAGNOSES:  1.  Acute respiratory failure secondary to aspiration pneumonitis, now much improved. 2.  Escherichia coli septicemia. 3.  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  4.  Hypothyroidism.  5.  Hyperlipidemia.  6  Benign prostatic hypertrophy.  7.  Colon cancer.  8.  Anemia of chronic disease.   DISCHARGE INSTRUCTIONS:  The patient is being discharged on tube feedings as tolerated. Follow-up is with Dr. Lorre NickGittin in the next 1 to 2 weeks. Also follow up with Dr. Vale HavenJoseph Wilson, his primary care physician, in the next 1 to 2 weeks.  DISCHARGE MEDICATIONS:  1.  Synthroid 100 mcg daily. 2.  Glycopyrrolate 1 mg 2 tabs t.i.[name]. 3.  Protonix 40 mg daily. 4.  Finasteride 5 mg daily. 5.  Magnesium oxide 400 mg via G-tube daily. 6.  Colace 10 mL b.i.[name]. 7.  Iron polysaccharide 1 cap b.i.[name]. 8.  Dantrolene 50 mg t.i.[name].  9.  MiraLax daily. 10.  Baclofen 20 mg t.i.[name]. 11.  Tussin 10 mL b.i.[name]. 12.  Tylenol 650 mg q. 4 hours as needed.  13.  Tylenol with oxycodone 5/325 mg 1 to 2 tabs q. 4 hours as needed. 14.  Atorvastatin 10 mg daily. 15.  Aspirin 81 mg daily. 16.  Keppra 500 mg b.i.[name].  17.  Ceftin 500 mg b.i.[name]. x 10 days.   CONSULTANTS DURING THE HOSPITAL COURSE: Dr. Casimiro NeedleMichael Blocker from infectious disease and Dr. Harriett SineNancy Phifer from palliative care.   PERTINENT STUDIES DONE DURING THE HOSPITAL COURSE: A chest x-ray done on June 4th showing findings consistent with congestive heart failure.   A repeat chest x-ray on June 7th showing basilar air space disease which may represent atelectasis versus pneumonia.   Blood cultures noted to be positive for E. coli. Urine culture is negative.   HOSPITAL COURSE: This is a  64 year old male with medical problems as mentioned above who presented to the hospital on 03/13/2013 secondary to acute respiratory failure and also noted to have a leukocytosis and a suspected UTI.  1.  Acute respiratory failure. The most likely cause of the patient's acute respiratory failure was thought to be related to aspiration pneumonitis. Initially there was thought that she may have a pneumonia and therefore he was started on broad-spectrum antibiotics including linezolid and cefepime. An infectious disease consult was obtained. The patient was also first initially maintained on BiPAP but then eventually weaned off, is currently on minimal O2 and is currently off oxygen and clinically doing well. After seen by infectious disease, the patient was taken off broad-spectrum antibiotics as likely this was aspiration pneumonitis and not likely a true pneumonia.  2.  Septicemia. The patient was noted to have septicemia secondary to E. coli. The actual source of the E. coli septicemia is unclear. It is thought to be related to a urinary tract infection, although the patient's urine cultures have remained negative. The patient's blood culture, as mentioned, was positive for E. coli. The patient was switched over to cefepime and eventually to Ancef by infectious disease and is currently being discharged on p.o. Ceftin for a total of 2 weeks of antibiotic therapy for treatment of E. coli septicemia.  3.  Chronic anemia. This is secondary to colon  cancer. The patient follows up with Dr. Lorre Nick. While in the hospital, he did require 1 unit of packed red blood cells as his hemoglobin had dropped. His hemoglobin has since then improved and is currently stable.  4.  Urinary tract infection. This was likely the source of the E. coli septicemia. As mentioned initially, the patient was on cefepime, eventually switched over to IV Ancef, and is currently being discharged on p.o. Ceftin as mentioned.  5.  Colon cancer with  mets to the liver. The patient is followed by Dr. Lorre Nick. This currently is quiescent, in remission. The patient will continue follow-up with Dr. Lorre Nick. We did obtain a palliative care consult to discuss goals of care with the family and as per the family the patient still remains a FULL CODE.  6.  Neurogenic quadriplegia as a result of traumatic brain injury. The patient is total care and is cared for by his wife. The patient's wife does not require any home health services or any help at this time.  7.  Hypothyroidism. The patient was maintained on his Synthroid.  He will resume that.  8.  Hyperlipidemia. The patient was maintained on his atorvastatin. He will resume that. 9.  BPH. The patient was maintained on his finasteride and he will resume that upon discharge too.   DISPOSITION:  The patient is a FULL CODE. He is being discharged home.   TIME SPENT: 40 minutes. ____________________________ Rolly Pancake. Cherlynn Kaiser, MD vjs:sb [name]: 03/18/2013 16:23:48 ET T: 03/18/2013 16:43:53 ET JOB#: [id number]  cc: Rolly Pancake. Cherlynn Kaiser, MD, <Dictator> Knute Neu. Lorre Nick, MD Vale Haven, MD Houston Siren MD ELECTRONICALLY SIGNED 03/28/2013 20:35

## 2015-01-29 NOTE — Consult Note (Signed)
Chief Complaint:  Subjective/Chief Complaint seen for abdominal distension.  some increase of distension today, no emesis.  TF restarted at low levels.   VITAL SIGNS/ANCILLARY NOTES: **Vital Signs.:   24-Jun-14 17:15  Vital Signs Type 15 min Post Blood Start Time  Temperature Temperature (F) 96  Celsius 35.5  Temperature Source oral  Pulse Pulse 58  Pulse source if not from Vital Sign Device per cardiac monitor  Respirations Respirations 12  Systolic BP Systolic BP 299  Diastolic BP (mmHg) Diastolic BP (mmHg) 75  Mean BP 91  BP Source  if not from Vital Sign Device non-invasive  Pulse Ox % Pulse Ox % 96  Oxygen Delivery Ventilator Assisted  Access Pressure -46  Venous Pressure 94  Effluent Pressure 143  Balance Chamber Pressure 172  Therapy Fluid Rate (L/hr) 2.5  Blood Flow Rate (ml/min) 300  Ultrafiltration Rate (mL/hr) 75   Lab Results: Hepatic:  24-Jun-14 08:48   Bilirubin, Total  < 0.1  Bilirubin, Direct < 0.05 (Result(s) reported on 01 Apr 2013 at 11:34AM.)  Alkaline Phosphatase  27  SGPT (ALT) 65  SGOT (AST)  842  Total Protein, Serum  5.8  Albumin, Serum  1.2  LabUnknown:  24-Jun-14 08:48   Result Interpretation Blood smear reviewed. Platelets are adequate. RBCs are microcytic, without morphologic changes to suggest a microangiopathic process. There is neutrophilic leukocytosis without immature cells. Spun hematocrit is 12.6%. Antibody screen is negative and crossmatched unit is compatible. DAT is pending at this time. Further investigation of the patient's apparent intravascular hemolysis is needed, to include possible complications from continuous renal replacement therapy and drug effects. The case was discussed with Dr. Cynda Acres on 04/01/13. Blain Pais, MD.  Routine BB:  24-Jun-14 12:31   Crossmatch Unit 1 Ready (Result(s) reported on 01 Apr 2013 at 04:46PM.)  Crossmatch Unit 1 Ready (Result(s) reported on 01 Apr 2013 at 01:42PM.)  Direct Coombs,  Polyspecific Negative (Result(s) reported on 01 Apr 2013 at 02:51PM.)  Direct Coombs, Neonatal IgG -  Antibody Screen NEGATIVE (Result(s) reported on 01 Apr 2013 at 01:29PM.)  ABO Group + Rh Type O Positive  Result(s) reported on 01 Apr 2013 at 01:03PM.  Routine Chem:  24-Jun-14 04:05   Result Comment labs - This specimen was collected through an   - indwelling catheter or arterial line.  - A minimum of 64ms of blood was wasted prior    - to collecting the sample.  Interpret  - results with caution. METB/MG/PHOS/CBC - EXTREME HEMOLYSIS  Result(s) reported on 01 Apr 2013 at 04:32AM.  Glucose, Serum -  BUN -  Creatinine (comp) -  Sodium, Serum -  Potassium, Serum -  Chloride, Serum -  CO2, Serum -  Calcium (Total), Serum -  Anion Gap -  Osmolality (calc) -  eGFR (African American) -  eGFR (Non-African American) - (eGFR values <626mmin/1.73 m2 may be an indication of chronic kidney disease (CKD). Calculated eGFR is useful in patients with stable renal function. The eGFR calculation will not be reliable in acutely ill patients when serum creatinine is changing rapidly. It is not useful in  patients on dialysis. The eGFR calculation may not be applicable to patients at the low and high extremes of body sizes, pregnant women, and vegetarians.)  Magnesium, Serum - (1.8-2.4 THERAPEUTIC RANGE: 4-7 mg/dL TOXIC: > 10 mg/dL  -----------------------)  Phosphorus, Serum - (Result(s) reported on 01 Apr 2013 at 04:32AM.)    04:40   Result Comment METB/MG/PHOS - SPECIMEN EXTREMLEY HEMOLYSED-REDRAW  -  REQUESTED BY DR.GOURU  Result(s) reported on 01 Apr 2013 at 06:29AM.  Glucose, Serum -  BUN -  Creatinine (comp) -  Sodium, Serum -  Potassium, Serum -  Chloride, Serum -  CO2, Serum -  Calcium (Total), Serum -  Anion Gap -  Osmolality (calc) -  eGFR (African American) -  eGFR (Non-African American) - (eGFR values <41m/min/1.73 m2 may be an indication of chronic kidney disease  (CKD). Calculated eGFR is useful in patients with stable renal function. The eGFR calculation will not be reliable in acutely ill patients when serum creatinine is changing rapidly. It is not useful in  patients on dialysis. The eGFR calculation may not be applicable to patients at the low and high extremes of body sizes, pregnant women, and vegetarians.)  Magnesium, Serum - (1.8-2.4 THERAPEUTIC RANGE: 4-7 mg/dL TOXIC: > 10 mg/dL  -----------------------)  Phosphorus, Serum - (Result(s) reported on 01 Apr 2013 at 06:29AM.)    06:43   Result Comment RBC/HCT/MCV/MCH/MCHC - UNABLE TO REPORT DUE TO INTERFERENCE OF  - SPECIMEN BEING EXREMELY HEMOLYZED.  - NOTIFIED PAM CRAWFORD OF HELD RESULTS @  - 0715 04-01-13. AJO  Result(s) reported on 01 Apr 2013 at 07:13AM.  Result Comment MET B - SEVERE HEMOLYSIS,INTERPRET RESULTS WITH  - CAUTION DUE TO RENAL REPLACEMENT.  Result(s) reported on 01 Apr 2013 at 07:19AM.  Glucose, Serum  51  BUN  20  Creatinine (comp) 1.29  Sodium, Serum  135  Potassium, Serum  3.4  Chloride, Serum 102  CO2, Serum 29  Calcium (Total), Serum  7.4  Anion Gap  4  Osmolality (calc) 270  eGFR (African American) >60  eGFR (Non-African American)  59 (eGFR values <676mmin/1.73 m2 may be an indication of chronic kidney disease (CKD). Calculated eGFR is useful in patients with stable renal function. The eGFR calculation will not be reliable in acutely ill patients when serum creatinine is changing rapidly. It is not useful in  patients on dialysis. The eGFR calculation may not be applicable to patients at the low and high extremes of body sizes, pregnant women, and vegetarians.)  Magnesium, Serum  1.7 (1.8-2.4 THERAPEUTIC RANGE: 4-7 mg/dL TOXIC: > 10 mg/dL  -----------------------)  Phosphorus, Serum 2.9 (Result(s) reported on 01 Apr 2013 at 07:34AM.)    08:48   Result Comment CHEMISTRIES - SPECIMEN EXTREMELY HEMOLYSED.INTERPRET  - RESULTS WITH CAUTION.  Result(s)  reported on 01 Apr 2013 at 11:34AM.  Result Comment RBC,HCT,MCV,MCH,MCHC - UNABLE TO REPORT DUE TO EXTREME  - HEMOLYSIS.THIS SPECIMEN WAS A  - VENIPUNCTURE.  Result(s) reported on 01 Apr 2013 at 09:32AM.  Result Comment POTASSIUM/CREATININE - SEVERE HEMOLYSIS,INTERPRET RESULTS WITH  - CAUTION DUE TO RENAL EXCHANGE.  Result(s) reported on 01 Apr 2013 at 09:32AM.  LDH, Serum  > 4000  Glucose, Serum  58  BUN  20  Creatinine (comp)  1.38  Sodium, Serum 138  Potassium, Serum 3.6  Chloride, Serum 105  CO2, Serum 30  Calcium (Total), Serum  7.1  Anion Gap  3  Osmolality (calc) 276  eGFR (African American) >60  eGFR (Non-African American)  55 (eGFR values <6025min/1.73 m2 may be an indication of chronic kidney disease (CKD). Calculated eGFR is useful in patients with stable renal function. The eGFR calculation will not be reliable in acutely ill patients when serum creatinine is changing rapidly. It is not useful in  patients on dialysis. The eGFR calculation may not be applicable to patients at the low and high extremes of body sizes,  pregnant women, and vegetarians.)    12:31   Result Comment RBC,HCT,MCV,MCH,MCHC - UNABLE TO REPORT DUE TO EXTREME  - HEMOLYSIS.THIS SPECIMEN WAS A LINE DRAW.  Result(s) reported on 01 Apr 2013 at 02:13PM.  Result Comment DAT - INCORRECT ORDER-SHOULD BE FOR NEONATAL  - PATIENTS ONLY.  Result(s) reported on 01 Apr 2013 at 02:58PM.  Routine Coag:  24-Jun-14 09:35   Prothrombin  17.1  INR 1.4 (INR reference interval applies to patients on anticoagulant therapy. A single INR therapeutic range for coumarins is not optimal for all indications; however, the suggested range for most indications is 2.0 - 3.0. Exceptions to the INR Reference Range may include: Prosthetic heart valves, acute myocardial infarction, prevention of myocardial infarction, and combinations of aspirin and anticoagulant. The need for a higher or lower target INR must be assessed  individually. Reference: The Pharmacology and Management of the Vitamin K  antagonists: the seventh ACCP Conference on Antithrombotic and Thrombolytic Therapy. PHXTA.5697 Sept:126 (3suppl): [id number]. A HCT value >55% may artifactually increase the PT.  In one study,  the increase was an average of 25%. Reference:  "Effect on Routine and Special Coagulation Testing Values of Citrate Anticoagulant Adjustment in Patients with High HCT Values." American Journal of Clinical Pathology 2006;126:400-405.)  Fibrin Degradation Products  > 40 (Result(s) reported on 01 Apr 2013 at 11:46AM.)  Routine Hem:  24-Jun-14 04:05   WBC (CBC) -  RBC (CBC) -  Hemoglobin (CBC) -  Hematocrit (CBC) -  Platelet Count (CBC) -  MCV -  MCH -  MCHC -  RDW -  Bands -  Segmented Neutrophils -  Lymphocytes -  Monocytes -  Eosinophil -  Metamyelocyte -  Diff Comment 1 -  Diff Comment 2 -  Diff Comment 3 -  Diff Comment 4 -  Neutrophil % -  Lymphocyte % -  Monocyte % -  Eosinophil % -  Basophil % -  Neutrophil # -  Lymphocyte # -  Monocyte # -  Eosinophil # -  Basophil # - (Result(s) reported on 01 Apr 2013 at 06:35AM.)  Variant Lymphocytes -  Basophil -  Myelocyte -  Promyelocyte -  Blast-Like -  Other Cells -  NRBC -  Diff Comment 5 -  Diff Comment 6 -  Diff Comment 7 -  Diff Comment 8 -  Diff Comment 9 -  Diff Comment 10 - (Result(s) reported on 01 Apr 2013 at 06:35AM.)    06:43   WBC (CBC)  16.9  RBC (CBC) See Comment  Hemoglobin (CBC)  5.4  Hematocrit (CBC) See Comment  Platelet Count (CBC) 291  MCV See Comment  MCH See Comment  MCHC See Comment  RDW  26.4  Neutrophil % 83.4  Lymphocyte % 3.0  Monocyte % 11.6  Eosinophil % 1.7  Basophil % 0.3  Neutrophil #  14.1  Lymphocyte #  0.5  Monocyte #  2.0  Eosinophil # 0.3  Basophil # 0.1    08:48   WBC (CBC)  19.7  RBC (CBC) See Comment  Hemoglobin (CBC)  5.2  Hematocrit (CBC) See Comment  Platelet Count (CBC) 304  MCV See  Comment  MCH See Comment  MCHC See Comment  RDW  26.6  Neutrophil % 82.2  Lymphocyte % 2.9  Monocyte % 12.6  Eosinophil % 1.7  Basophil % 0.6  Neutrophil #  16.2  Lymphocyte #  0.6  Monocyte #  2.5  Eosinophil # 0.3  Basophil # 0.1  12:31   WBC (CBC)  20.9  RBC (CBC) See Comment  Hemoglobin (CBC)  5.1  Hematocrit (CBC) See Comment  Platelet Count (CBC) 318  MCV See Comment  MCH See Comment  MCHC See Comment  RDW  26.9  Bands 4  Segmented Neutrophils 77  Lymphocytes 9  Monocytes 7  Eosinophil 2  Metamyelocyte 1  Diff Comment 1 ANISOCYTOSIS  Diff Comment 2 POIKILOCYTOSIS  Diff Comment 3 TARGET CELLS  Diff Comment 4 PLTS VARIED IN SIZE  Result(s) reported on 01 Apr 2013 at 02:13PM.   Radiology Results: XRay:    24-Jun-14 06:35, Abdomen Flat and Erect  Abdomen Flat and Erect   REASON FOR EXAM:    abdominal distension.  do in CCU  COMMENTS:       PROCEDURE: DXR - DXR ABDOMEN 2 V FLAT AND ERECT  - Apr 01 2013  6:35AM     RESULT: Abdominal images obtained with portable technique demonstrate   what appears to be a gastrostomy tube in the mid abdominal region.   Atherosclerotic calcification is present. No abnormal bowel distention is   evident. Cardiac monitoring electrodes are present.    IMPRESSION:   1. No abnormal bowel distention.    Dictation Site: 2    Verified By: Sundra Aland, M.D., MD  CT:    24-Jun-14 14:36, CT Abdomen and Pelvis Without Contrast  CT Abdomen and Pelvis Without Contrast   REASON FOR EXAM:    (1) sig abd distention; (2) sig abd distention  COMMENTS:       PROCEDURE: CT  - CT ABDOMEN AND PELVIS W0  - Apr 01 2013  2:36PM     RESULT: Axial noncontrast CT scanning was performed through the abdomen   and pelvis with reconstructions at 3 mm intervals and slice thicknesses.   Comparison is made to the contrast enhanced study of March 20, 2013.    Since the previous study there have developed small bilateral pleural   effusions.  There remains bibasilar atelectasis and/or pneumonia. The   cardiac chambers are not enlarged. There are coronary artery   calcifications    The liver is normal in contour. The known abnormal masses in the right     and left lobes are poorly defined on this noncontrast exam. There is no   intrahepatic ductal dilation. The gallbladder is mildly distended but   exhibits no evidence of stones or surrounding inflammatory change. The   pancreas, spleen, nondistended stomach, adrenal glands, and kidneys   exhibit no acute abnormality. A PEG tube is present and appears to be in   reasonable position in the gastric antrum.    The unopacified loops of small and large bowel exhibit no evidence of   ileus nor of obstruction. There is a moderate amount of free pelvic   fluid. No free extraluminal gas collections are demonstrated. A structure   is present which may reflect an uninflamed appendix. The caliber of the   abdominal aorta is normal. There is no periaortic or pericaval   lymphadenopathy. There is no evidence of a psoas abscess. The urinary   bladder is decompressed with a Foley catheter. Again demonstrated is   bladder wall thickening which is not as well-defined given the relatively     nondistended state of the bladder.    Since the previous study increased subcutaneous density bilaterally   consistent with anasarca has progressed. This is likely responsible for   the apparent abdominal distention rather than space-occupying  processes   within the peritoneal cavity.    IMPRESSION:   1. Since the previous study there has developed free pelvic fluid. Some   lies with between bowel loops while additional fluid lies in the   prerectal space.  2. Increased subcutaneous fat density diffusely has developed consistent   with progressive anasarca.  3. There are small bilateral pleural effusions. There is persistent   bibasilar atelectasis.  4. Known hepatic parenchymal masses are less well  demonstrated today but   subtle abnormalities remain in both the right and left hepatic lobes. The   gallbladder remains mildly distended.  5. There is no evidence of a small or large bowel obstruction or ileus.   There is no evidence of enteritis or colitis. The cecum is somewhat less   conspicuous today but the possibility of underlying malignancy here is   not excluded.  6. The urinarybladder wall remains thickened.    Dictation Site: 1        Verified By: DAVID A. Martinique, M.D., MD   Assessment/Plan:  Assessment/Plan:  Assessment 1) abdominal distension.  results of todays film and ct noted.  no abnormal bowel wall or luminal enlargement.  increasing abdominal distension probably related to increasing anasarca and hypoalbuminemia.   2) c.diff-treatment finishing.   Plan 1) could consider small daily dose of albumin to try to decrease peripheral edema.  would discuss with Renal in regard to impace on ultrafiltration.   Electronic Signatures: Loistine Simas (MD)  (Signed 24-Jun-14 20:27)  Authored: Chief Complaint, VITAL SIGNS/ANCILLARY NOTES, Lab Results, Radiology Results, Assessment/Plan   Last Updated: 24-Jun-14 20:27 by Loistine Simas (MD)

## 2015-01-29 NOTE — Consult Note (Signed)
Brief Consult Note: Diagnosis: abdominal distension.   Patient was seen by consultant.   Consult note dictated.   Recommend further assessment or treatment.   Comments: Please see full GI consult.  Patient noted with increasing abdominal distension.  Patient admitted with aspiration pna and respiratory failure, currently on vent.  Being treated for C. diff, on po vanc 250mg  q 6 hours.  Abd film today not remarkable.  Recommend daily abd films, if distension increases, consider repeat CT for comparison with previous.   Continue current treatment for C. diff, will add florastor.  Following.  Electronic Signatures: Barnetta ChapelSkulskie, Martin (MD)  (Signed 22-Jun-14 16:28)  Authored: Brief Consult Note   Last Updated: 22-Jun-14 16:28 by Barnetta ChapelSkulskie, Martin (MD)
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
## 2015-01-29 NOTE — Op Note (Signed)
PATIENT NAME:  [name], [name] MR#:  [id number] DATE OF BIRTH:  Dec 31, 1950  DATE OF PROCEDURE:  03/19/2013  PREOPERATIVE DIAGNOSIS: Respiratory failure.    POSTOPERATIVE DIAGNOSIS: Respiratory failure.    PROCEDURE PERFORMED: Right subclavian central line insertion.   ANESTHESIA: Local.   SURGEON: Carmie Endalph L. Ely III, M.D.   OPERATIVE PROCEDURE: Central line access was requested of this patient by the ED physician. A right external jugular line had been placed, and the patient was hypotensive. There was a need for pressor medications, so indication for a central line as well as increased access for pressor medications. He had had a previous left EJ and IJ. The patient was just discharged from the hospital yesterday.   With the patient in the supine position, the patient's left neck was prepped with ChloraPrep and draped with sterile towels. The area was infiltrated with 1% Xylocaine. The neck was investigated with the ultrasound. A small internal jugular vein was identified. It was cannulated without difficulty, but a wire would not pass into the great vessel system after multiple attempts. The vein was cannulated twice and on both attempts, the wire would not pass through the great vessel system. The drapes and towels were taken down and the patient reprepped and draped on the right side, care being taken to avoid dislodging the external jugular vein catheter on that side. Again, the area was prepped with ChloraPrep and anesthetized with Xylocaine. Again, the vein was quite small. The patient was placed in steep Trendelenburg. The vein was increased in size, did compress and appeared to be free of thrombus. It was cannulated again 2 times, but I could not pass a wire on the right side either. Multiple attempts were made without success. Again, the drapes and towels were taken down. The patient was reprepped and draped in the right subclavian area. The vein was cannulated on a single pass and a wire  passed into the great vessel system without difficulty. The skin was incised and the wire used to dilate the tract. The dilator was removed and a triple-lumen catheter inserted over the wire into the great vessel system. The wire was removed. The catheter was secured in place over a Biospot dressing. The ports were capped and flushed. Sterile dressing was applied. Chest x-ray is pending.    ____________________________ Carmie Endalph L. Ely III, MD rle:gb D: 03/19/2013 23:42:17 ET T: 03/19/2013 23:57:19 ET JOB#: [id number]  cc: Quentin Orealph L. Ely III, MD, <Dictator> Quentin OreALPH L ELY MD ELECTRONICALLY SIGNED 03/21/2013 6:21
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
## 2015-01-29 NOTE — Consult Note (Signed)
   Comments   Follow up visit made. Daughter here but family will wait for granddaughter to arrive. Conditional orders placed for extubation. Will proceed when family is ready.   Electronic Signatures: Shanta Hartner, Harriett SineNancy (MD)  (Signed 27-Jun-14 16:41)  Authored: Palliative Care   Last Updated: 27-Jun-14 16:41 by Daviyon Widmayer, Harriett SineNancy (MD)
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
## 2015-01-29 NOTE — Consult Note (Signed)
   Comments   I spoke with pt's wife at length. She has cared for pt at home since 12/2011. [facility] 02/2011-12/2011 [facility] or [facility]. Wife says that, though it is difficult, she is committed to caring for pt at home. She has an aide who comes and sits with pt when wife needs to leave the house. They have a son but he lives in [place] and is unable to help with pt's care.  spoke with wife about code status. She wants pt to be a full code for now. However, if his colon cancer progresses and she feels that resuscitation would be futile, she would consider changing code status.  follows with Dr Lorre NickGittin for his colon cancer. Wife says that, if tx were offered, she would discuss with pt and let him make that decision. She is in agreement with Dr Paula ComptonGittin's plan for now which is watchful waiting.  suggested that wife might consider hospice services in the home, esp if pt's cancer progresses. She is not interested in this at this time.   Electronic Signatures: Mrytle Bento, Harriett SineNancy (MD)  (Signed 05-Jun-14 17:29)  Authored: Palliative Care   Last Updated: 05-Jun-14 17:29 by Zahrah Sutherlin, Harriett SineNancy (MD)
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
## 2015-08-06 ENCOUNTER — Telehealth: Payer: Self-pay | Admitting: *Deleted

## 2015-08-06 NOTE — Telephone Encounter (Signed)
Opened in error
